# Patient Record
Sex: Female | Born: 1980 | Hispanic: No | Marital: Married | Smoking: Never smoker
Health system: Southern US, Community
[De-identification: ages and names within clinical notes are randomized; demographics above are authoritative.]

## PROBLEM LIST (undated history)

## (undated) ENCOUNTER — Inpatient Hospital Stay (HOSPITAL_COMMUNITY): Payer: Self-pay

## (undated) ENCOUNTER — Emergency Department (HOSPITAL_COMMUNITY): Discharge status: Against Medical Advice | Payer: BC Managed Care – PPO

## (undated) DIAGNOSIS — B1910 Unspecified viral hepatitis B without hepatic coma: Secondary | ICD-10-CM

## (undated) DIAGNOSIS — D219 Benign neoplasm of connective and other soft tissue, unspecified: Secondary | ICD-10-CM

## (undated) DIAGNOSIS — O98419 Viral hepatitis complicating pregnancy, unspecified trimester: Secondary | ICD-10-CM

## (undated) HISTORY — DX: Viral hepatitis complicating pregnancy, unspecified trimester: O98.419

## (undated) HISTORY — DX: Unspecified viral hepatitis B without hepatic coma: B19.10

---

## 2012-06-26 ENCOUNTER — Other Ambulatory Visit: Payer: Self-pay | Admitting: Obstetrics & Gynecology

## 2012-06-29 ENCOUNTER — Inpatient Hospital Stay (HOSPITAL_COMMUNITY): Admission: RE | Admit: 2012-06-29 | Payer: Self-pay | Source: Ambulatory Visit

## 2012-06-30 ENCOUNTER — Encounter (HOSPITAL_COMMUNITY): Payer: Self-pay | Admitting: Anesthesiology

## 2012-06-30 ENCOUNTER — Encounter (HOSPITAL_COMMUNITY)
Admission: RE | Disposition: A | Discharge status: Home / Self Care | Payer: Self-pay | Source: Ambulatory Visit | Attending: Obstetrics & Gynecology

## 2012-06-30 ENCOUNTER — Encounter (HOSPITAL_COMMUNITY): Payer: Self-pay | Admitting: *Deleted

## 2012-06-30 ENCOUNTER — Ambulatory Visit (HOSPITAL_COMMUNITY)
Admission: RE | Admit: 2012-06-30 | Discharge: 2012-06-30 | Disposition: A | Discharge status: Home / Self Care | Payer: BC Managed Care – PPO | Source: Ambulatory Visit | Attending: Obstetrics & Gynecology | Admitting: Obstetrics & Gynecology

## 2012-06-30 ENCOUNTER — Ambulatory Visit (HOSPITAL_COMMUNITY): Payer: BC Managed Care – PPO | Admitting: Anesthesiology

## 2012-06-30 DIAGNOSIS — N840 Polyp of corpus uteri: Secondary | ICD-10-CM | POA: Insufficient documentation

## 2012-06-30 DIAGNOSIS — R9389 Abnormal findings on diagnostic imaging of other specified body structures: Secondary | ICD-10-CM | POA: Insufficient documentation

## 2012-06-30 DIAGNOSIS — N920 Excessive and frequent menstruation with regular cycle: Secondary | ICD-10-CM | POA: Insufficient documentation

## 2012-06-30 HISTORY — PX: DILITATION & CURRETTAGE/HYSTROSCOPY WITH VERSAPOINT RESECTION: SHX5571

## 2012-06-30 LAB — CBC
Platelets: 247 10*3/uL (ref 150–400)
RDW: 12.8 % (ref 11.5–15.5)
WBC: 6.3 10*3/uL (ref 4.0–10.5)

## 2012-06-30 SURGERY — DILATATION & CURETTAGE/HYSTEROSCOPY WITH VERSAPOINT RESECTION
Anesthesia: General | Wound class: Clean Contaminated

## 2012-06-30 MED ORDER — LACTATED RINGERS IV SOLN
INTRAVENOUS | Status: DC
  Administered 2012-06-30: 13:00:00 via INTRAVENOUS
  Administered 2012-06-30: 100 mL/h via INTRAVENOUS

## 2012-06-30 MED ORDER — PROPOFOL 10 MG/ML IV BOLUS
INTRAVENOUS | Status: DC | PRN
  Administered 2012-06-30: 170 mg via INTRAVENOUS

## 2012-06-30 MED ORDER — CHLOROPROCAINE HCL 1 % IJ SOLN
INTRAMUSCULAR | Status: AC
  Filled 2012-06-30: qty 30

## 2012-06-30 MED ORDER — OXYCODONE-ACETAMINOPHEN 7.5-325 MG PO TABS: 1.0000 | ORAL_TABLET | ORAL | Status: DC | PRN

## 2012-06-30 MED ORDER — CEFAZOLIN SODIUM-DEXTROSE 2-3 GM-% IV SOLR
2.0000 g | INTRAVENOUS | Status: AC
  Administered 2012-06-30: 2 g via INTRAVENOUS

## 2012-06-30 MED ORDER — MIDAZOLAM HCL 5 MG/5ML IJ SOLN
INTRAMUSCULAR | Status: DC | PRN
  Administered 2012-06-30: 2 mg via INTRAVENOUS

## 2012-06-30 MED ORDER — CHLOROPROCAINE HCL 1 % IJ SOLN
INTRAMUSCULAR | Status: DC | PRN
  Administered 2012-06-30: 20 mL

## 2012-06-30 MED ORDER — KETOROLAC TROMETHAMINE 30 MG/ML IJ SOLN
INTRAMUSCULAR | Status: AC
  Filled 2012-06-30: qty 1

## 2012-06-30 MED ORDER — LIDOCAINE HCL (CARDIAC) 20 MG/ML IV SOLN
INTRAVENOUS | Status: DC | PRN
  Administered 2012-06-30: 50 mg via INTRAVENOUS

## 2012-06-30 MED ORDER — FENTANYL CITRATE 0.05 MG/ML IJ SOLN
25.0000 ug | INTRAMUSCULAR | Status: DC | PRN
  Administered 2012-06-30 (×2): 50 ug via INTRAVENOUS

## 2012-06-30 MED ORDER — MIDAZOLAM HCL 2 MG/2ML IJ SOLN
INTRAMUSCULAR | Status: AC
  Filled 2012-06-30: qty 2

## 2012-06-30 MED ORDER — CEFAZOLIN SODIUM-DEXTROSE 2-3 GM-% IV SOLR
INTRAVENOUS | Status: AC
  Filled 2012-06-30: qty 50

## 2012-06-30 MED ORDER — FENTANYL CITRATE 0.05 MG/ML IJ SOLN
INTRAMUSCULAR | Status: AC
  Filled 2012-06-30: qty 2

## 2012-06-30 MED ORDER — ONDANSETRON HCL 4 MG/2ML IJ SOLN
INTRAMUSCULAR | Status: DC | PRN
  Administered 2012-06-30: 4 mg via INTRAVENOUS

## 2012-06-30 MED ORDER — SODIUM CHLORIDE 0.9 % IR SOLN
Status: DC | PRN
  Administered 2012-06-30: 1

## 2012-06-30 MED ORDER — ONDANSETRON HCL 4 MG/2ML IJ SOLN
INTRAMUSCULAR | Status: AC
  Filled 2012-06-30: qty 2

## 2012-06-30 MED ORDER — LIDOCAINE HCL (CARDIAC) 20 MG/ML IV SOLN
INTRAVENOUS | Status: AC
  Filled 2012-06-30: qty 5

## 2012-06-30 MED ORDER — FENTANYL CITRATE 0.05 MG/ML IJ SOLN
INTRAMUSCULAR | Status: AC
  Administered 2012-06-30: 50 ug via INTRAVENOUS
  Filled 2012-06-30: qty 2

## 2012-06-30 MED ORDER — KETOROLAC TROMETHAMINE 30 MG/ML IJ SOLN
INTRAMUSCULAR | Status: DC | PRN
  Administered 2012-06-30: 30 mg via INTRAVENOUS

## 2012-06-30 MED ORDER — PROPOFOL 10 MG/ML IV EMUL
INTRAVENOUS | Status: AC
  Filled 2012-06-30: qty 20

## 2012-06-30 MED ORDER — FENTANYL CITRATE 0.05 MG/ML IJ SOLN
INTRAMUSCULAR | Status: DC | PRN
  Administered 2012-06-30: 50 ug via INTRAVENOUS
  Administered 2012-06-30 (×2): 25 ug via INTRAVENOUS

## 2012-06-30 SURGICAL SUPPLY — 14 items
CANISTER SUCTION 2500CC (MISCELLANEOUS) ×2 IMPLANT
CATH ROBINSON RED A/P 16FR (CATHETERS) ×2 IMPLANT
CLOTH BEACON ORANGE TIMEOUT ST (SAFETY) ×2 IMPLANT
CONTAINER PREFILL 10% NBF 60ML (FORM) ×4 IMPLANT
DRESSING TELFA 8X3 (GAUZE/BANDAGES/DRESSINGS) ×2 IMPLANT
ELECTRODE RT ANGLE VERSAPOINT (CUTTING LOOP) ×2 IMPLANT
GLOVE BIO SURGEON STRL SZ 6.5 (GLOVE) ×4 IMPLANT
GLOVE BIOGEL PI IND STRL 7.0 (GLOVE) ×1 IMPLANT
GLOVE BIOGEL PI INDICATOR 7.0 (GLOVE) ×1
GOWN STRL REIN XL XLG (GOWN DISPOSABLE) ×6 IMPLANT
PACK HYSTEROSCOPY LF (CUSTOM PROCEDURE TRAY) ×2 IMPLANT
PAD OB MATERNITY 4.3X12.25 (PERSONAL CARE ITEMS) ×2 IMPLANT
TOWEL OR 17X24 6PK STRL BLUE (TOWEL DISPOSABLE) ×4 IMPLANT
WATER STERILE IRR 1000ML POUR (IV SOLUTION) ×2 IMPLANT

## 2012-06-30 NOTE — Transfer of Care (Signed)
Immediate Anesthesia Transfer of Care Note  Patient: Erica Hurst  Procedure(s) Performed: Procedure(s): DILATATION & CURETTAGE/HYSTEROSCOPY WITH VERSAPOINT RESECTION (N/A)  Patient Location: PACU  Anesthesia Type:General  Level of Consciousness: awake, alert , oriented and patient cooperative  Airway & Oxygen Therapy: Patient Spontanous Breathing and Patient connected to nasal cannula oxygen  Post-op Assessment: Report given to PACU RN and Post -op Vital signs reviewed and stable  Post vital signs: Reviewed and stable  Complications: No apparent anesthesia complications

## 2012-06-30 NOTE — Discharge Summary (Signed)
  Physician Discharge Summary  Patient ID: Erica Hurst MRN: [id number] DOB/AGE: 09-08-1980 31 y.o.  Admit date: 06/30/2012 Discharge date: 06/30/2012  Admission Diagnoses: Endometrial Polyp  58558  Discharge Diagnoses: Endometrial Polyp  40981        Active Problems:   * No active hospital problems. *   Discharged Condition: good  Hospital Course:  Outpatient  Consults: None  Treatments: surgery: Hysteroscopy, Versapoint resection, D+C.  Disposition: Final discharge disposition not confirmed     Medication List    TAKE these medications       ibuprofen 200 MG tablet  Commonly known as:  ADVIL,MOTRIN  Take 400 mg by mouth 2 (two) times daily as needed for pain.     multivitamin with minerals Tabs  Take 1 tablet by mouth daily.     oxyCODONE-acetaminophen 7.5-325 MG per tablet  Commonly known as:  PERCOCET  Take 1 tablet by mouth every 4 (four) hours as needed for pain.           Follow-up Information   Follow up with Laderius Valbuena,MARIE-LYNE, MD In 3 weeks.   Contact information:   [address] [place] [address] [phone]       Signed: Genia Del, MD 06/30/2012, 12:48 PM

## 2012-06-30 NOTE — Anesthesia Postprocedure Evaluation (Signed)
  Anesthesia Post-op Note  Patient: Erica Hurst  Procedure(s) Performed: Procedure(s): DILATATION & CURETTAGE/HYSTEROSCOPY WITH VERSAPOINT RESECTION (N/A)  Patient is awake and responsive. Pain and nausea are reasonably well controlled. Vital signs are stable and clinically acceptable. Oxygen saturation is clinically acceptable. There are no apparent anesthetic complications at this time. Patient is ready for discharge.

## 2012-06-30 NOTE — H&P (Signed)
Erica Hurst is an 32 y.o. female  G0  RP:  Menometro with IU polyp  Pertinent Gynecological History: Menses: flow is excessive with use of many pads or tampons on heaviest days Bleeding: intermenstrual bleeding Contraception: none Blood transfusions: none Sexually transmitted diseases: no past history Previous GYN Procedures: none  Last pap: normal OB History: G0   Menstrual History:  Patient's last menstrual period was 06/22/2012.    History reviewed. No pertinent past medical history.  History reviewed. No pertinent past surgical history.  History reviewed. No pertinent family history.  Social History:  reports that she has never smoked. She does not have any smokeless tobacco history on file. Her alcohol and drug histories are not on file.  Allergies: No Known Allergies  Prescriptions prior to admission  Medication Sig Dispense Refill  . ibuprofen (ADVIL,MOTRIN) 200 MG tablet Take 400 mg by mouth 2 (two) times daily as needed for pain.      . Multiple Vitamin (MULTIVITAMIN WITH MINERALS) TABS Take 1 tablet by mouth daily.         Last menstrual period 06/22/2012.  Pelvic US thick endometrium.  SonoHysto:  IU polyp  Results for orders placed during the hospital encounter of 06/30/12 (from the past 24 hour(s))  CBC     Status: Abnormal   Collection Time    06/30/12 10:40 AM      Result Value Range   WBC 6.3  4.0 - 10.5 K/uL   RBC 5.12 (*) 3.87 - 5.11 MIL/uL   Hemoglobin 14.7  12.0 - 15.0 g/dL   HCT 62.1  30.8 - 65.7 %   MCV 82.8  78.0 - 100.0 fL   MCH 28.7  26.0 - 34.0 pg   MCHC 34.7  30.0 - 36.0 g/dL   RDW 84.6  96.2 - 95.2 %   Platelets 247  150 - 400 K/uL    No results found.  Assessment/Plan: Menometro with IU polyp for HSC resection Versapoint, D+C.  Surgery and risks reviewed.  Cyara Devoto,MARIE-LYNE 06/30/2012, 11:14 AM

## 2012-06-30 NOTE — Anesthesia Preprocedure Evaluation (Signed)

## 2012-06-30 NOTE — Op Note (Signed)
06/30/2012  12:33 PM  PATIENT:  Erica Hurst  32 y.o. female  PRE-OPERATIVE DIAGNOSIS: Menometrorrhagia, Endometrial Polyp  [phone]  POST-OPERATIVE DIAGNOSIS:  Menometrorrhagia, Endometrial Polyp, thick endometrium  [id number]  PROCEDURE:  Procedure(s): DILATATION & CURETTAGE/HYSTEROSCOPY WITH VERSAPOINT RESECTION  SURGEON:  Surgeon(s): Genia Del, MD  ASSISTANTS: none   ANESTHESIA:   general with LM  PROCEDURE:  Under general anesthesia with laryngeal mask, the patient is in lithotomy position.  She is prepped with Betadine on the suprapubic, vulvar and vaginal areas.  She is draped as usual. The bladder is catheterized.  The vaginal exam revealed and anteverted uterus mobile no adnexal mass. The speculum is inserted in the vagina. The anterior lip of the cervix is grasped with a tenaculum. A paracervical block is done with Nesacaine 1% a total of 20 cc at 4 and 8:00.  Dilation of the cervix with Hegar dilators up to #33 without difficulty. The operative hysteroscope is inserted in the intrauterine cavity with the VersaPoint device.  Pictures are taken. The endometrium is 6 throughout the intrauterine cavity. A polypoid area is visualized on the right posterior wall.  This polypoid lesion is resected. The other thickest areas of endometrium are resected superficially as well.  We then removed the hysteroscope. We proceed with a systematic curettage of the intrauterine cavity with a sharp curet on all surfaces.  Both specimens are sent together to pathology.  We go back with the hysteroscope and confirmed a normal intrauterine cavity with both ostia visualized and pictures taken. Hemostasis is adequate. We removed the hysteroscope. We also removed the tenaculum on the intraloop of the cervix. Hemostasis is adequate. Were removed the speculum.  The patient was brought to recovery room in good and stable status.  ESTIMATED BLOOD LOSS:  10 cc FLUID DEFICIT: 180 CC  Intake/Output Summary  (Last 24 hours) at 06/30/12 1233 Last data filed at 06/30/12 1215  Gross per 24 hour  Intake    900 ml  Output      0 ml  Net    900 ml     BLOOD ADMINISTERED:none   LOCAL MEDICATIONS USED:  Nesacaine 1% 20 cc.  SPECIMEN:  Source of Specimen:  Endometrial curettings, resection of polypoid endometrium  DISPOSITION OF SPECIMEN:  PATHOLOGY  COUNTS:  YES  PLAN OF CARE: Transfer to PACU   Genia Del  06/30/2102 at 12:35 pm

## 2012-06-30 NOTE — Preoperative (Signed)
Beta Blockers   Reason not to administer Beta Blockers:Not Applicable 

## 2012-07-03 ENCOUNTER — Encounter (HOSPITAL_COMMUNITY): Payer: Self-pay | Admitting: Obstetrics & Gynecology

## 2013-11-07 LAB — OB RESULTS CONSOLE ABO/RH: RH Type: POSITIVE

## 2013-11-07 LAB — OB RESULTS CONSOLE RUBELLA ANTIBODY, IGM: Rubella: IMMUNE

## 2013-11-07 LAB — OB RESULTS CONSOLE RPR: RPR: NONREACTIVE

## 2013-11-07 LAB — OB RESULTS CONSOLE ANTIBODY SCREEN: ANTIBODY SCREEN: NEGATIVE

## 2013-11-07 LAB — OB RESULTS CONSOLE GC/CHLAMYDIA
CHLAMYDIA, DNA PROBE: NEGATIVE
GC PROBE AMP, GENITAL: NEGATIVE

## 2013-11-07 LAB — OB RESULTS CONSOLE HEPATITIS B SURFACE ANTIGEN: Hepatitis B Surface Ag: POSITIVE

## 2013-11-07 LAB — OB RESULTS CONSOLE HIV ANTIBODY (ROUTINE TESTING): HIV: NONREACTIVE

## 2013-11-21 LAB — OB RESULTS CONSOLE GBS: GBS: POSITIVE

## 2013-11-23 ENCOUNTER — Encounter: Payer: Self-pay | Admitting: Internal Medicine

## 2013-11-23 ENCOUNTER — Ambulatory Visit (INDEPENDENT_AMBULATORY_CARE_PROVIDER_SITE_OTHER): Payer: Medicaid Other | Admitting: Internal Medicine

## 2013-11-23 VITALS — BP 105/70 | HR 90 | Temp 97.9°F | Wt 163.0 lb

## 2013-11-23 DIAGNOSIS — O98519 Other viral diseases complicating pregnancy, unspecified trimester: Secondary | ICD-10-CM

## 2013-11-23 DIAGNOSIS — O98411 Viral hepatitis complicating pregnancy, first trimester: Principal | ICD-10-CM

## 2013-11-23 DIAGNOSIS — B1910 Unspecified viral hepatitis B without hepatic coma: Secondary | ICD-10-CM | POA: Insufficient documentation

## 2013-11-23 LAB — COMPLETE METABOLIC PANEL WITH GFR
ALK PHOS: 28 U/L — AB (ref 39–117)
ALT: 16 U/L (ref 0–35)
AST: 12 U/L (ref 0–37)
Albumin: 3.4 g/dL — ABNORMAL LOW (ref 3.5–5.2)
BILIRUBIN TOTAL: 0.2 mg/dL (ref 0.2–1.2)
BUN: 7 mg/dL (ref 6–23)
CO2: 24 mEq/L (ref 19–32)
Calcium: 9.1 mg/dL (ref 8.4–10.5)
Chloride: 104 mEq/L (ref 96–112)
Creat: 0.56 mg/dL (ref 0.50–1.10)
GFR, Est African American: >89 mL/min
GLUCOSE: 86 mg/dL (ref 70–99)
Potassium: 4 mEq/L (ref 3.5–5.3)
SODIUM: 136 meq/L (ref 135–145)
Total Protein: 6.3 g/dL (ref 6.0–8.3)

## 2013-11-23 LAB — HEPATITIS A ANTIBODY, TOTAL: Hep A Total Ab: REACTIVE — AB

## 2013-11-23 LAB — HEPATITIS C ANTIBODY: HCV AB: NEGATIVE

## 2013-11-23 NOTE — Addendum Note (Signed)
Addended by: Dolan Amen D on: 11/23/2013 09:56 AM   Modules accepted: Orders

## 2013-11-23 NOTE — Assessment & Plan Note (Signed)
I discussed the natural history of hepatitis B, transmission, long term prognosis, indications for treatment including elevated viral load during the third trimester.  She is asymptomatic.  Will check labs and have her return in about 2 weeks with results.  Will also check elastography if validated in pregnancy (will check with radiology), otherwise will do after delivery.  I discussed routine follow up including twice a year ultrasound.  I also discussed treatment for the baby which will be done with vaccine and immunoglobulin.

## 2013-11-23 NOTE — Progress Notes (Signed)
   Subjective:    Patient ID: Roselyn Meier, female    DOB: 09/11/1980, 33 y.o.   MRN: [id number]  HPI Here for evaluation for positive hepatitis B surface Ag test.  Is her first pregnancy and has no known history of hepatitis B or other liver disease.  No known family members with hepatitis B or liver disease. No history of jaundice.  Is HIV negative.  Is french speaking and translation done through her husband.  No other medical problems.  Is in her first trimester with due date about December 19th.     Review of Systems  Constitutional: Negative for fever and unexpected weight change.  Gastrointestinal: Negative for abdominal pain and diarrhea.  Allergic/Immunologic: Negative for immunocompromised state.  Neurological: Negative for dizziness.       Objective:   Physical Exam  Constitutional: She appears well-developed and well-nourished. No distress.  HENT:  Mouth/Throat: No oropharyngeal exudate.  Eyes: No scleral icterus.  Cardiovascular: Normal rate, regular rhythm and normal heart sounds.   No murmur heard. Pulmonary/Chest: Effort normal and breath sounds normal. No respiratory distress. She has no wheezes.  Abdominal: Soft. She exhibits no distension.  Skin: No rash noted.          Assessment & Plan:

## 2013-11-26 LAB — HEPATITIS B E ANTIGEN: Hepatitis Be Antigen: NONREACTIVE

## 2013-11-26 LAB — HEPATITIS B E ANTIBODY: Hepatitis Be Antibody: REACTIVE — AB

## 2013-11-27 LAB — HEPATITIS B DNA, ULTRAQUANTITATIVE, PCR
HEPATITIS B DNA (CALC): 384 {copies}/mL — AB (ref <116)
HEPATITIS B DNA: 66 [IU]/mL — AB (ref <20)

## 2013-12-17 ENCOUNTER — Ambulatory Visit: Payer: Medicaid Other | Admitting: Internal Medicine

## 2013-12-17 ENCOUNTER — Encounter: Payer: Self-pay | Admitting: Internal Medicine

## 2013-12-17 ENCOUNTER — Ambulatory Visit (INDEPENDENT_AMBULATORY_CARE_PROVIDER_SITE_OTHER): Payer: Medicaid Other | Admitting: Internal Medicine

## 2013-12-17 VITALS — BP 115/73 | HR 86 | Temp 98.2°F | Wt 165.0 lb

## 2013-12-17 DIAGNOSIS — B1910 Unspecified viral hepatitis B without hepatic coma: Secondary | ICD-10-CM

## 2013-12-17 DIAGNOSIS — O98519 Other viral diseases complicating pregnancy, unspecified trimester: Secondary | ICD-10-CM

## 2013-12-17 DIAGNOSIS — O98411 Viral hepatitis complicating pregnancy, first trimester: Principal | ICD-10-CM

## 2013-12-17 NOTE — Assessment & Plan Note (Addendum)
Very minimal viral load and no indication for treatment.  I will recheck her DNA after delivery and do elastography as well after that.  She is going to call a few months after delivery to schedule a follow up with me and will do above. 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  Neonate will still require HBIG and vaccine x 3.

## 2013-12-17 NOTE — Progress Notes (Signed)
   Subjective:    Patient ID: Erica Hurst, female    DOB: [birth year], 33 y.o.   MRN: [id number]  HPI  Here for evaluation for positive hepatitis B surface Ag test.  Is her first pregnancy and has no known history of hepatitis B or other liver disease.  No known family members with hepatitis B or liver disease. No history of jaundice.  Is HIV negative.  Is french speaking and translation done today by interpreter.  No other medical problems.  Is in her first trimester with due date about December 19th.     Her work up revealed e Ag negative, viral load of 66 IU/384 copies per mL.  LFTs wnl.  E Ab positive.     Review of Systems  Constitutional: Negative for fever and unexpected weight change.  Gastrointestinal: Negative for abdominal pain and diarrhea.  Allergic/Immunologic: Negative for immunocompromised state.  Neurological: Negative for dizziness.       Objective:   Physical Exam  Constitutional: She appears well-developed and well-nourished. No distress.  HENT:  Mouth/Throat: No oropharyngeal exudate.  Eyes: No scleral icterus.  Cardiovascular: Normal rate, regular rhythm and normal heart sounds.   No murmur heard. Pulmonary/Chest: Effort normal and breath sounds normal. No respiratory distress. She has no wheezes.  Abdominal: Soft. She exhibits no distension.  Skin: No rash noted.          Assessment & Plan:

## 2014-01-04 ENCOUNTER — Encounter (HOSPITAL_COMMUNITY): Payer: Self-pay | Admitting: *Deleted

## 2014-01-04 ENCOUNTER — Inpatient Hospital Stay (HOSPITAL_COMMUNITY)
Admission: AD | Admit: 2014-01-04 | Discharge: 2014-01-05 | Disposition: A | Discharge status: Home / Self Care | Payer: Medicaid Other | Source: Ambulatory Visit | Attending: Obstetrics & Gynecology | Admitting: Obstetrics & Gynecology

## 2014-01-04 DIAGNOSIS — O99891 Other specified diseases and conditions complicating pregnancy: Secondary | ICD-10-CM | POA: Diagnosis not present

## 2014-01-04 DIAGNOSIS — N949 Unspecified condition associated with female genital organs and menstrual cycle: Secondary | ICD-10-CM | POA: Diagnosis not present

## 2014-01-04 DIAGNOSIS — O212 Late vomiting of pregnancy: Secondary | ICD-10-CM | POA: Insufficient documentation

## 2014-01-04 DIAGNOSIS — R109 Unspecified abdominal pain: Secondary | ICD-10-CM | POA: Insufficient documentation

## 2014-01-04 DIAGNOSIS — O9989 Other specified diseases and conditions complicating pregnancy, childbirth and the puerperium: Secondary | ICD-10-CM

## 2014-01-04 HISTORY — DX: Benign neoplasm of connective and other soft tissue, unspecified: D21.9

## 2014-01-04 LAB — URINALYSIS, ROUTINE W REFLEX MICROSCOPIC
Bilirubin Urine: NEGATIVE
Glucose, UA: NEGATIVE mg/dL
Hgb urine dipstick: NEGATIVE
Ketones, ur: NEGATIVE mg/dL
Leukocytes, UA: NEGATIVE
NITRITE: NEGATIVE
Protein, ur: NEGATIVE mg/dL
UROBILINOGEN UA: 0.2 mg/dL (ref 0.0–1.0)
pH: 5.5 (ref 5.0–8.0)

## 2014-01-04 NOTE — MAU Note (Addendum)
PT SAYS SHE STARTED YESTERDAY HAVING  PAIN IN LOWER ABD .   TODAY- AFTERNOON- FEELING NAUSEA-  NO VOMITING.  SEEN IN OFFICE ON Tuesday-  OFFICE  CALLED HER THIS AM- TOLD UTI-   CALLED HER IN AMOX - STARTED TODAY - FELT NAUSEA.       SAYS SHE HAS HX OF UTI

## 2014-01-05 MED ORDER — ONDANSETRON 8 MG PO TBDP: 8.0000 mg | ORAL_TABLET | Freq: Three times a day (TID) | ORAL | Status: DC | PRN

## 2014-01-05 NOTE — Discharge Instructions (Signed)
Nausea and Vomiting °Nausea is a sick feeling that often comes before throwing up (vomiting). Vomiting is a reflex where stomach contents come out of your mouth. Vomiting can cause severe loss of body fluids (dehydration). Children and elderly adults can become dehydrated quickly, especially if they also have diarrhea. Nausea and vomiting are symptoms of a condition or disease. It is important to find the cause of your symptoms. °CAUSES  °· Direct irritation of the stomach lining. This irritation can result from increased acid production (gastroesophageal reflux disease), infection, food poisoning, taking certain medicines (such as nonsteroidal anti-inflammatory drugs), alcohol use, or tobacco use. °· Signals from the brain. These signals could be caused by a headache, heat exposure, an inner ear disturbance, increased pressure in the brain from injury, infection, a tumor, or a concussion, pain, emotional stimulus, or metabolic problems. °· An obstruction in the gastrointestinal tract (bowel obstruction). °· Illnesses such as diabetes, hepatitis, gallbladder problems, appendicitis, kidney problems, cancer, sepsis, atypical symptoms of a heart attack, or eating disorders. °· Medical treatments such as chemotherapy and radiation. °· Receiving medicine that makes you sleep (general anesthetic) during surgery. °DIAGNOSIS °Your caregiver may ask for tests to be done if the problems do not improve after a few days. Tests may also be done if symptoms are severe or if the reason for the nausea and vomiting is not clear. Tests may include: °· Urine tests. °· Blood tests. °· Stool tests. °· Cultures (to look for evidence of infection). °· X-rays or other imaging studies. °Test results can help your caregiver make decisions about treatment or the need for additional tests. °TREATMENT °You need to stay well hydrated. Drink frequently but in small amounts. You may wish to drink water, sports drinks, clear broth, or eat frozen  ice pops or gelatin dessert to help stay hydrated. When you eat, eating slowly may help prevent nausea. There are also some antinausea medicines that may help prevent nausea. °HOME CARE INSTRUCTIONS  °· Take all medicine as directed by your caregiver. °· If you do not have an appetite, do not force yourself to eat. However, you must continue to drink fluids. °· If you have an appetite, eat a normal diet unless your caregiver tells you differently. °¨ Eat a variety of complex carbohydrates (rice, wheat, potatoes, bread), lean meats, yogurt, fruits, and vegetables. °¨ Avoid high-fat foods because they are more difficult to digest. °· Drink enough water and fluids to keep your urine clear or pale yellow. °· If you are dehydrated, ask your caregiver for specific rehydration instructions. Signs of dehydration may include: °¨ Severe thirst. °¨ Dry lips and mouth. °¨ Dizziness. °¨ Dark urine. °¨ Decreasing urine frequency and amount. °¨ Confusion. °¨ Rapid breathing or pulse. °SEEK IMMEDIATE MEDICAL CARE IF:  °· You have blood or brown flecks (like coffee grounds) in your vomit. °· You have black or bloody stools. °· You have a severe headache or stiff neck. °· You are confused. °· You have severe abdominal pain. °· You have chest pain or trouble breathing. °· You do not urinate at least once every 8 hours. °· You develop cold or clammy skin. °· You continue to vomit for longer than 24 to 48 hours. °· You have a fever. °MAKE SURE YOU:  °· Understand these instructions. °· Will watch your condition. °· Will get help right away if you are not doing well or get worse. °Document Released: 05/03/2005 Document Revised: 07/26/2011 Document Reviewed: 09/30/2010 °ExitCare® Patient Information ©2015 [employer]. This information is not intended   to replace advice given to you by your health care provider. Make sure you discuss any questions you have with your health care provider. Penicillin V tablets What is this  medicine? PENICILLIN V (pen i SILL in V) is a penicillin antibiotic. It is used to treat certain kinds of bacterial infections. It will not work for colds, flu, or other viral infections. This medicine may be used for other purposes; ask your health care provider or pharmacist if you have questions. COMMON BRAND NAME(S): Bayard Beaver, Veetids What should I tell my health care provider before I take this medicine? They need to know if you have any of these conditions: -asthma -bowel disease, like colitis -eczema -kidney disease -an unusual or allergic reaction to penicillin, cephalosporins, other antibiotics or medicines, foods, tartrazine or other dyes, or preservatives -pregnant or trying to get pregnant -breast-feeding How should I use this medicine? Take this medicine by mouth with a full glass of water. Follow the directions on the prescription label. Take your medicine at regular intervals. Do not take your medicine more often than directed. Take all of your medicine as directed even if you think your are better. Do not skip doses or stop your medicine early. Talk to your pediatrician regarding the use of this medicine in children. While this drug may be prescribed for selected conditions, precautions do apply. Overdosage: If you think you have taken too much of this medicine contact a poison control center or emergency room at once. NOTE: This medicine is only for you. Do not share this medicine with others. What if I miss a dose? If you miss a dose, take it as soon as you can. If it is almost time for your next dose, take only that dose. Do not take double or extra doses. What may interact with this medicine? -birth control pills -methotrexate -other antibiotics -probenecid -some vaccines This list may not describe all possible interactions. Give your health care provider a list of all the medicines, herbs, non-prescription drugs, or dietary supplements you use. Also tell them if you  smoke, drink alcohol, or use illegal drugs. Some items may interact with your medicine. What should I watch for while using this medicine? Tell your doctor or health care professional if your symptoms do not improve. Do not treat diarrhea with over the counter products. Contact your doctor if you have diarrhea that lasts more than 2 days or if it is severe and watery. If you have diabetes, you may get a false-positive result for sugar in your urine. Check with your doctor or health care professional. Birth control pills may not work properly while you are taking this medicine. Talk to your doctor about using an extra method of birth control. What side effects may I notice from receiving this medicine? Side effects that you should report to your doctor or health care professional as soon as possible: -allergic reactions like skin rash or hives, swelling of the face, lips, or tongue -breathing problems -fever -new symptoms of infection -redness, blistering, peeling or loosening of the skin, including inside the mouth -unusually weak or tired Side effects that usually do not require medical attention (report to your doctor or health care professional if they continue or are bothersome): -diarrhea -headache -nausea, vomiting -sore mouth or tongue -stomach upset This list may not describe all possible side effects. Call your doctor for medical advice about side effects. You may report side effects to FDA at 1-800-FDA-1088. Where should I keep my medicine? Keep out  of the reach of children. Store at room temperature between 15 and 30 degrees C (59 and 86 degrees F). Keep container tightly closed. Throw away any unused medicine after the expiration date. NOTE: This sheet is a summary. It may not cover all possible information. If you have questions about this medicine, talk to your doctor, pharmacist, or health care provider.  2015, Elsevier/Gold Standard. (2007-11-30 12:59:13)

## 2014-01-05 NOTE — MAU Provider Note (Signed)
History   Patient is a 33y.o. G3P0020 at 23wks, who presents unannounced, for cramping and nausea. Patient reports that this started yesterday and started taking medication today for bacteriuria.  Patient reports nausea started today at around 5pm prior to taking PCN V and states she ate at 2pm.  Patient denies vomiting, cramping/contractions, LOF, or VB and reports active fetus.    Patient Active Problem List   Diagnosis Date Noted  . 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  HPI  OB History   Grav Para Term Preterm Abortions TAB SAB Ect Mult Living   3    2  2          Past Medical History  Diagnosis Date  . Hepatitis B affecting pregnancy   . Fibroid     Past Surgical History  Procedure Laterality Date  . Dilitation & currettage/hystroscopy with versapoint resection N/A 06/30/2012    Procedure: DILATATION & CURETTAGE/HYSTEROSCOPY WITH VERSAPOINT RESECTION;  Surgeon: Princess Bruins, MD;  Location: [facility];  Service: Gynecology;  Laterality: N/A;    Family History  Problem Relation Age of Onset  . Liver disease Neg Hx     History  Substance Use Topics  . Smoking status: Never Smoker   . Smokeless tobacco: Not on file  . Alcohol Use: No    Allergies: No Known Allergies  Prescriptions prior to admission  Medication Sig Dispense Refill  . ibuprofen (ADVIL,MOTRIN) 200 MG tablet Take 400 mg by mouth 2 (two) times daily as needed for pain.      . Multiple Vitamin (MULTIVITAMIN WITH MINERALS) TABS Take 1 tablet by mouth daily.      Marland Kitchen oxyCODONE-acetaminophen (PERCOCET) 7.5-325 MG per tablet Take 1 tablet by mouth every 4 (four) hours as needed for pain.  20 tablet  0    ROS  See HPI Above Physical Exam   Blood pressure 113/63, pulse 92, temperature 99.2 F (37.3 C), temperature source Oral, resp. rate 20, height 5\' 2"  (1.575 m), weight 164 lb 2 oz (74.447 kg).  Physical Exam  Constitutional: She is oriented to  person, place, and time. She appears well-developed and well-nourished.  Cardiovascular: Normal rate.   Respiratory: Effort normal.  GI: Soft. There is tenderness in the right lower quadrant and left lower quadrant. There is no rigidity and no guarding.  Appears gravid--fundal height appropriate for GA   Musculoskeletal: Normal range of motion.  Neurological: She is alert and oriented to person, place, and time.  Skin: Skin is warm and dry.    ED Course  Assessment: IUP at 23wks Nausea Round Ligament Pain  Plan: -Patient declines anti-emetic -RX sent for usage prior to antibiotic usage -Discussed proper usage of antibiotics for UTI as patient currently taking once a day when should be taking BID -Discussed round ligament pain -Bleeding and PTL Precautions  -Keep appt as scheduled: 9/15 -Call if you have any questions or concerns prior to your next visit.  -Discharged to home in stable condition   Jazzy Parmer LYNN CNM, MSN 01/05/2014 12:10 AM

## 2014-03-03 ENCOUNTER — Inpatient Hospital Stay (HOSPITAL_COMMUNITY)
Admission: AD | Admit: 2014-03-03 | Discharge: 2014-03-03 | Disposition: A | Discharge status: Home / Self Care | Payer: Medicaid Other | Source: Ambulatory Visit | Attending: Obstetrics and Gynecology | Admitting: Obstetrics and Gynecology

## 2014-03-03 ENCOUNTER — Encounter (HOSPITAL_COMMUNITY): Payer: Self-pay

## 2014-03-03 ENCOUNTER — Inpatient Hospital Stay (HOSPITAL_COMMUNITY): Payer: Medicaid Other

## 2014-03-03 DIAGNOSIS — B1910 Unspecified viral hepatitis B without hepatic coma: Secondary | ICD-10-CM

## 2014-03-03 DIAGNOSIS — O368130 Decreased fetal movements, third trimester, not applicable or unspecified: Secondary | ICD-10-CM | POA: Insufficient documentation

## 2014-03-03 DIAGNOSIS — Z3A31 31 weeks gestation of pregnancy: Secondary | ICD-10-CM | POA: Insufficient documentation

## 2014-03-03 DIAGNOSIS — O98411 Viral hepatitis complicating pregnancy, first trimester: Secondary | ICD-10-CM

## 2014-03-03 DIAGNOSIS — O368190 Decreased fetal movements, unspecified trimester, not applicable or unspecified: Secondary | ICD-10-CM

## 2014-03-03 DIAGNOSIS — O368131 Decreased fetal movements, third trimester, fetus 1: Secondary | ICD-10-CM

## 2014-03-03 NOTE — Discharge Instructions (Signed)

## 2014-03-03 NOTE — MAU Provider Note (Signed)
Erica Hurst is a 33 y.o. G3P0020 at 31.1 weeks c/o decreased fetal movement x 1 day.  She denies vb or lof.  She has a history of two SAB, at 4 and 6 weeks.   History     Patient Active Problem List   Diagnosis Date Noted  . Hepatitis B affecting pregnancy in first trimester 11/23/2013    Chief Complaint  Patient presents with  . Decreased Fetal Movement   HPI  OB History   Grav Para Term Preterm Abortions TAB SAB Ect Mult Living   3    2  2          Past Medical History  Diagnosis Date  . Hepatitis B affecting pregnancy   . Fibroid     Past Surgical History  Procedure Laterality Date  . Dilitation & currettage/hystroscopy with versapoint resection N/A 06/30/2012    Procedure: DILATATION & CURETTAGE/HYSTEROSCOPY WITH VERSAPOINT RESECTION;  Surgeon: Princess Bruins, MD;  Location: [facility];  Service: Gynecology;  Laterality: N/A;    Family History  Problem Relation Age of Onset  . Liver disease Neg Hx     History  Substance Use Topics  . Smoking status: Never Smoker   . Smokeless tobacco: Not on file  . Alcohol Use: No    Allergies: No Known Allergies  Prescriptions prior to admission  Medication Sig Dispense Refill  . penicillin v potassium (VEETID) 500 MG tablet Take 500 mg by mouth 2 (two) times daily.      . Prenatal Vit-Fe Fumarate-FA (PRENATAL MULTIVITAMIN) TABS tablet Take 1 tablet by mouth daily at 12 noon.        ROS See HPI above, all other systems are negative  Physical Exam   Resp. rate 16, height 5\' 2"  (1.575 m), weight 76.885 kg (169 lb 8 oz).  Physical Exam Ext:  WNL ABD: Soft, non tender to palpation, no rebound or guarding SVE: deferred   ED Course  Assessment: IUP at  31.1weeks Membranes: intact FHR: 150 minimum variability, no decels no accel CTX:  occasional irribility   Plan: PO hydration [place] - BPP and AFI   Jahn Franchini, CNM, MSN 03/03/2014. 1:13 PM

## 2014-03-03 NOTE — MAU Note (Signed)
Pt states decreased fm since yesterday. Denies bleeding or abnormal discharge. Did have ctx's yesterday.

## 2014-03-03 NOTE — MAU Provider Note (Signed)
MAU Addendum Note  [place] results: FHR 144, anterior placenta, AFI WNL, BPP 8/8 DC to home  FU in the office in 1 week   Bayard More, CNM, MSN 03/03/2014. 3:20 PM

## 2014-03-18 ENCOUNTER — Encounter (HOSPITAL_COMMUNITY): Payer: Self-pay

## 2014-05-10 ENCOUNTER — Inpatient Hospital Stay (HOSPITAL_COMMUNITY)
Admission: AD | Admit: 2014-05-10 | Discharge: 2014-05-10 | Disposition: A | Discharge status: Home / Self Care | Payer: Medicaid Other | Source: Ambulatory Visit | Attending: Obstetrics and Gynecology | Admitting: Obstetrics and Gynecology

## 2014-05-10 ENCOUNTER — Encounter (HOSPITAL_COMMUNITY): Payer: Self-pay | Admitting: *Deleted

## 2014-05-10 DIAGNOSIS — O471 False labor at or after 37 completed weeks of gestation: Secondary | ICD-10-CM | POA: Insufficient documentation

## 2014-05-10 DIAGNOSIS — B1910 Unspecified viral hepatitis B without hepatic coma: Secondary | ICD-10-CM | POA: Diagnosis not present

## 2014-05-10 DIAGNOSIS — O98413 Viral hepatitis complicating pregnancy, third trimester: Secondary | ICD-10-CM | POA: Diagnosis not present

## 2014-05-10 DIAGNOSIS — Z3A40 40 weeks gestation of pregnancy: Secondary | ICD-10-CM | POA: Diagnosis not present

## 2014-05-10 DIAGNOSIS — O479 False labor, unspecified: Secondary | ICD-10-CM

## 2014-05-10 NOTE — MAU Provider Note (Signed)
History    Erica Hurst is a 33y.o. G3P0020 at 40.6wks who presents, unannounced, for contractions.  Patient states contractions started at 0100.  Patient reports VB and active fetus, but denies LOF.    Patient Active Problem List   Diagnosis Date Noted  . Hepatitis B affecting pregnancy in first trimester 11/23/2013    Chief Complaint  Patient presents with  . Labor Eval   HPI  OB History    Gravida Para Term Preterm AB TAB SAB Ectopic Multiple Living   3    2  2          Past Medical History  Diagnosis Date  . Hepatitis B affecting pregnancy   . Fibroid     Past Surgical History  Procedure Laterality Date  . Dilitation & currettage/hystroscopy with versapoint resection N/A 06/30/2012    Procedure: DILATATION & CURETTAGE/HYSTEROSCOPY WITH VERSAPOINT RESECTION;  Surgeon: Princess Bruins, MD;  Location: [facility];  Service: Gynecology;  Laterality: N/A;    Family History  Problem Relation Age of Onset  . Liver disease Neg Hx     History  Substance Use Topics  . Smoking status: Never Smoker   . Smokeless tobacco: Not on file  . Alcohol Use: No    Allergies: No Known Allergies  Prescriptions prior to admission  Medication Sig Dispense Refill Last Dose  . penicillin v potassium (VEETID) 500 MG tablet Take 500 mg by mouth 2 (two) times daily.   03/03/2014 at Unknown time  . Prenatal Vit-Fe Fumarate-FA (PRENATAL MULTIVITAMIN) TABS tablet Take 1 tablet by mouth daily at 12 noon.   03/02/2014 at Unknown time    ROS  See HPI Above Physical Exam   Blood pressure 116/80, pulse 76, temperature 98.4 F (36.9 C), temperature source Oral, resp. rate 18, height 5\' 6"  (1.676 m), weight 173 lb (78.472 kg).  Physical Exam SVE: 1/50/Ballotable/Medium/Posterior FHR: 135 bpm, Mod Var, -Decels, +Accels UC: Q3-49min, palpates mild ED Course  Assessment: IUP at 40.6wks Cat I FT Contractions  Plan: -Await for reactive NST -Patient given the option for ambulation or  discharge to home  -Patient opts to ambulate  Follow Up (0430) -Nurse call stating patient would like to go home -Strip Reviewed, reactive -Okay to discharge to home -Nurse instructed to provide family with information on who and when to call for concerns  Seana Underwood LYNN CNM, MSN 05/10/2014 4:31 AM

## 2014-05-10 NOTE — MAU Note (Signed)
Pt has changed her mind and would like to go home, Milinda Cave CNM aware and gave orders that pt. May be discharged.

## 2014-05-10 NOTE — MAU Note (Signed)
Pt states that she began contracting at 0100 with some bloody show and mucous. Pt denies leaking of fluid.

## 2014-05-10 NOTE — MAU Note (Signed)
J EMly CNM coming to unit.

## 2014-05-10 NOTE — Discharge Instructions (Signed)
Third Trimester of Pregnancy The third trimester is from week 29 through week 42, months 7 through 9. This trimester is when your unborn baby (fetus) is growing very fast. At the end of the ninth month, the unborn baby is about 20 inches in length. It weighs about 6-10 pounds.  HOME CARE   Avoid all smoking, herbs, and alcohol. Avoid drugs not approved by your doctor.  Only take medicine as told by your doctor. Some medicines are safe and some are not during pregnancy.  Exercise only as told by your doctor. Stop exercising if you start having cramps.  Eat regular, healthy meals.  Wear a good support bra if your breasts are tender.  Do not use hot tubs, steam rooms, or saunas.  Wear your seat belt when driving.  Avoid raw meat, uncooked cheese, and liter boxes and soil used by cats.  Take your prenatal vitamins.  Try taking medicine that helps you poop (stool softener) as needed, and if your doctor approves. Eat more fiber by eating fresh fruit, vegetables, and whole grains. Drink enough fluids to keep your pee (urine) clear or pale yellow.  Take warm water baths (sitz baths) to soothe pain or discomfort caused by hemorrhoids. Use hemorrhoid cream if your doctor approves.  If you have puffy, bulging veins (varicose veins), wear support hose. Raise (elevate) your feet for 15 minutes, 3-4 times a day. Limit salt in your diet.  Avoid heavy lifting, wear low heels, and sit up straight.  Rest with your legs raised if you have leg cramps or low back pain.  Visit your dentist if you have not gone during your pregnancy. Use a soft toothbrush to brush your teeth. Be gentle when you floss.  You can have sex (intercourse) unless your doctor tells you not to.  Do not travel far distances unless you must. Only do so with your doctor's approval.  Take prenatal classes.  Practice driving to the hospital.  Pack your hospital bag.  Prepare the baby's room.  Go to your doctor visits. GET  HELP IF: 1. You are not sure if you are in labor or if your water has broken. 2. You are dizzy. 3. You have mild cramps or pressure in your lower belly (abdominal). 4. You have a nagging pain in your belly area. 5. You continue to feel sick to your stomach (nauseous), throw up (vomit), or have watery poop (diarrhea). 6. You have bad smelling fluid coming from your vagina. 7. You have pain with peeing (urination). GET HELP RIGHT AWAY IF:   You have a fever.  You are leaking fluid from your vagina.  You are spotting or bleeding from your vagina.  You have severe belly cramping or pain.  You lose or gain weight rapidly.  You have trouble catching your breath and have chest pain.  You notice sudden or extreme puffiness (swelling) of your face, hands, ankles, feet, or legs.  You have not felt the baby move in over an hour.  You have severe headaches that do not go away with medicine.  You have vision changes. Document Released: 07/28/2009 Document Revised: 08/28/2012 Document Reviewed: 07/04/2012 [facility] Patient Information 2015 [place], [place]. This information is not intended to replace advice given to you by your health care provider. Make sure you discuss any questions you have with your health care provider. Fetal Movement Counts Patient Name: __________________________________________________ Patient Due Date: ____________________ Performing a fetal movement count is highly recommended in high-risk pregnancies, but it is good  for every pregnant woman to do. Your health care provider may ask you to start counting fetal movements at 28 weeks of the pregnancy. Fetal movements often increase:  After eating a full meal.  After physical activity.  After eating or drinking something sweet or cold.  At rest. Pay attention to when you feel the baby is most active. This will help you notice a pattern of your baby's sleep and wake cycles and what factors contribute to an increase in  fetal movement. It is important to perform a fetal movement count at the same time each day when your baby is normally most active.  HOW TO COUNT FETAL MOVEMENTS 8. Find a quiet and comfortable area to sit or lie down on your left side. Lying on your left side provides the best blood and oxygen circulation to your baby. 9. Write down the day and time on a sheet of paper or in a journal. 10. Start counting kicks, flutters, swishes, rolls, or jabs in a 2-hour period. You should feel at least 10 movements within 2 hours. 11. If you do not feel 10 movements in 2 hours, wait 2-3 hours and count again. Look for a change in the pattern or not enough counts in 2 hours. SEEK MEDICAL CARE IF:  You feel less than 10 counts in 2 hours, tried twice.  There is no movement in over an hour.  The pattern is changing or taking longer each day to reach 10 counts in 2 hours.  You feel the baby is not moving as he or she usually does. Date: ____________ Movements: ____________ Start time: ____________ Elizebeth Koller time: ____________  Date: ____________ Movements: ____________ Start time: ____________ Elizebeth Koller time: ____________ Date: ____________ Movements: ____________ Start time: ____________ Elizebeth Koller time: ____________ Date: ____________ Movements: ____________ Start time: ____________ Elizebeth Koller time: ____________ Date: ____________ Movements: ____________ Start time: ____________ Elizebeth Koller time: ____________ Date: ____________ Movements: ____________ Start time: ____________ Elizebeth Koller time: ____________ Date: ____________ Movements: ____________ Start time: ____________ Elizebeth Koller time: ____________ Date: ____________ Movements: ____________ Start time: ____________ Elizebeth Koller time: ____________  Date: ____________ Movements: ____________ Start time: ____________ Elizebeth Koller time: ____________ Date: ____________ Movements: ____________ Start time: ____________ Elizebeth Koller time: ____________ Date: ____________ Movements: ____________ Start time:  ____________ Elizebeth Koller time: ____________ Date: ____________ Movements: ____________ Start time: ____________ Elizebeth Koller time: ____________ Date: ____________ Movements: ____________ Start time: ____________ Elizebeth Koller time: ____________ Date: ____________ Movements: ____________ Start time: ____________ Elizebeth Koller time: ____________ Date: ____________ Movements: ____________ Start time: ____________ Elizebeth Koller time: ____________  Date: ____________ Movements: ____________ Start time: ____________ Elizebeth Koller time: ____________ Date: ____________ Movements: ____________ Start time: ____________ Elizebeth Koller time: ____________ Date: ____________ Movements: ____________ Start time: ____________ Elizebeth Koller time: ____________ Date: ____________ Movements: ____________ Start time: ____________ Elizebeth Koller time: ____________ Date: ____________ Movements: ____________ Start time: ____________ Elizebeth Koller time: ____________ Date: ____________ Movements: ____________ Start time: ____________ Elizebeth Koller time: ____________ Date: ____________ Movements: ____________ Start time: ____________ Elizebeth Koller time: ____________  Date: ____________ Movements: ____________ Start time: ____________ Elizebeth Koller time: ____________ Date: ____________ Movements: ____________ Start time: ____________ Elizebeth Koller time: ____________ Date: ____________ Movements: ____________ Start time: ____________ Elizebeth Koller time: ____________ Date: ____________ Movements: ____________ Start time: ____________ Elizebeth Koller time: ____________ Date: ____________ Movements: ____________ Start time: ____________ Elizebeth Koller time: ____________ Date: ____________ Movements: ____________ Start time: ____________ Elizebeth Koller time: ____________ Date: ____________ Movements: ____________ Start time: ____________ Elizebeth Koller time: ____________  Date: ____________ Movements: ____________ Start time: ____________ Elizebeth Koller time: ____________ Date: ____________ Movements: ____________ Start time: ____________ Elizebeth Koller time: ____________ Date:  ____________ Movements: ____________ Start time: ____________ Elizebeth Koller time: ____________ Date: ____________  Movements: ____________ Start time: ____________ Elizebeth Koller time: ____________ Date: ____________ Movements: ____________ Start time: ____________ Elizebeth Koller time: ____________ Date: ____________ Movements: ____________ Start time: ____________ Elizebeth Koller time: ____________ Date: ____________ Movements: ____________ Start time: ____________ Elizebeth Koller time: ____________  Date: ____________ Movements: ____________ Start time: ____________ Elizebeth Koller time: ____________ Date: ____________ Movements: ____________ Start time: ____________ Elizebeth Koller time: ____________ Date: ____________ Movements: ____________ Start time: ____________ Elizebeth Koller time: ____________ Date: ____________ Movements: ____________ Start time: ____________ Elizebeth Koller time: ____________ Date: ____________ Movements: ____________ Start time: ____________ Elizebeth Koller time: ____________ Date: ____________ Movements: ____________ Start time: ____________ Elizebeth Koller time: ____________ Date: ____________ Movements: ____________ Start time: ____________ Elizebeth Koller time: ____________  Date: ____________ Movements: ____________ Start time: ____________ Elizebeth Koller time: ____________ Date: ____________ Movements: ____________ Start time: ____________ Elizebeth Koller time: ____________ Date: ____________ Movements: ____________ Start time: ____________ Elizebeth Koller time: ____________ Date: ____________ Movements: ____________ Start time: ____________ Elizebeth Koller time: ____________ Date: ____________ Movements: ____________ Start time: ____________ Elizebeth Koller time: ____________ Date: ____________ Movements: ____________ Start time: ____________ Elizebeth Koller time: ____________ Date: ____________ Movements: ____________ Start time: ____________ Elizebeth Koller time: ____________  Date: ____________ Movements: ____________ Start time: ____________ Elizebeth Koller time: ____________ Date: ____________ Movements: ____________ Start  time: ____________ Elizebeth Koller time: ____________ Date: ____________ Movements: ____________ Start time: ____________ Elizebeth Koller time: ____________ Date: ____________ Movements: ____________ Start time: ____________ Elizebeth Koller time: ____________ Date: ____________ Movements: ____________ Start time: ____________ Elizebeth Koller time: ____________ Date: ____________ Movements: ____________ Start time: ____________ Elizebeth Koller time: ____________ Document Released: 06/02/2006 Document Revised: 09/17/2013 Document Reviewed: 02/28/2012 [employer] Patient Information 2015 [employer]. This information is not intended to replace advice given to you by your health care provider. Make sure you discuss any questions you have with your health care provider.

## 2014-05-13 ENCOUNTER — Telehealth (HOSPITAL_COMMUNITY): Payer: Self-pay | Admitting: *Deleted

## 2014-05-13 NOTE — Telephone Encounter (Signed)
Preadmission screen  

## 2014-05-14 ENCOUNTER — Inpatient Hospital Stay (HOSPITAL_COMMUNITY): Payer: Medicaid Other | Admitting: Anesthesiology

## 2014-05-14 ENCOUNTER — Inpatient Hospital Stay (HOSPITAL_COMMUNITY)
Admission: RE | Admit: 2014-05-14 | Discharge: 2014-05-18 | DRG: 766 | Disposition: A | Discharge status: Home / Self Care | Payer: Medicaid Other | Source: Ambulatory Visit | Attending: Obstetrics and Gynecology | Admitting: Obstetrics and Gynecology

## 2014-05-14 ENCOUNTER — Encounter (HOSPITAL_COMMUNITY): Payer: Self-pay

## 2014-05-14 DIAGNOSIS — D649 Anemia, unspecified: Secondary | ICD-10-CM | POA: Diagnosis present

## 2014-05-14 DIAGNOSIS — Z3A41 41 weeks gestation of pregnancy: Secondary | ICD-10-CM | POA: Diagnosis present

## 2014-05-14 DIAGNOSIS — Z2251 Carrier of viral hepatitis B: Secondary | ICD-10-CM | POA: Diagnosis not present

## 2014-05-14 DIAGNOSIS — O99824 Streptococcus B carrier state complicating childbirth: Secondary | ICD-10-CM | POA: Diagnosis present

## 2014-05-14 DIAGNOSIS — O9081 Anemia of the puerperium: Secondary | ICD-10-CM | POA: Diagnosis present

## 2014-05-14 DIAGNOSIS — Z98891 History of uterine scar from previous surgery: Secondary | ICD-10-CM

## 2014-05-14 DIAGNOSIS — O480 Post-term pregnancy: Secondary | ICD-10-CM | POA: Diagnosis present

## 2014-05-14 DIAGNOSIS — B951 Streptococcus, group B, as the cause of diseases classified elsewhere: Secondary | ICD-10-CM | POA: Diagnosis present

## 2014-05-14 LAB — CBC
HEMATOCRIT: 37.6 % (ref 36.0–46.0)
Hemoglobin: 13.1 g/dL (ref 12.0–15.0)
MCH: 29.4 pg (ref 26.0–34.0)
MCHC: 34.8 g/dL (ref 30.0–36.0)
MCV: 84.3 fL (ref 78.0–100.0)
Platelets: 206 10*3/uL (ref 150–400)
RBC: 4.46 MIL/uL (ref 3.87–5.11)
RDW: 14.3 % (ref 11.5–15.5)
WBC: 6.1 10*3/uL (ref 4.0–10.5)

## 2014-05-14 LAB — HIV ANTIBODY (ROUTINE TESTING W REFLEX): HIV 1&2 Ab, 4th Generation: NONREACTIVE

## 2014-05-14 LAB — RPR

## 2014-05-14 MED ORDER — PHENYLEPHRINE 40 MCG/ML (10ML) SYRINGE FOR IV PUSH (FOR BLOOD PRESSURE SUPPORT)
80.0000 ug | PREFILLED_SYRINGE | INTRAVENOUS | Status: DC | PRN
  Administered 2014-05-14: 80 ug via INTRAVENOUS

## 2014-05-14 MED ORDER — OXYTOCIN BOLUS FROM INFUSION: 500.0000 mL | INTRAVENOUS | Status: DC

## 2014-05-14 MED ORDER — ONDANSETRON HCL 4 MG/2ML IJ SOLN
4.0000 mg | Freq: Four times a day (QID) | INTRAMUSCULAR | Status: DC | PRN
  Administered 2014-05-14: 4 mg via INTRAVENOUS
  Filled 2014-05-14: qty 2

## 2014-05-14 MED ORDER — OXYTOCIN 40 UNITS IN LACTATED RINGERS INFUSION - SIMPLE MED: 62.5000 mL/h | INTRAVENOUS | Status: DC

## 2014-05-14 MED ORDER — OXYTOCIN 40 UNITS IN LACTATED RINGERS INFUSION - SIMPLE MED: 1.0000 m[IU]/min | INTRAVENOUS | Status: DC

## 2014-05-14 MED ORDER — FENTANYL 2.5 MCG/ML BUPIVACAINE 1/10 % EPIDURAL INFUSION (WH - ANES)
14.0000 mL/h | INTRAMUSCULAR | Status: DC | PRN
  Filled 2014-05-14: qty 125

## 2014-05-14 MED ORDER — NALBUPHINE HCL 10 MG/ML IJ SOLN: 10.0000 mg | INTRAMUSCULAR | Status: DC | PRN

## 2014-05-14 MED ORDER — DIPHENHYDRAMINE HCL 50 MG/ML IJ SOLN: 12.5000 mg | INTRAMUSCULAR | Status: DC | PRN

## 2014-05-14 MED ORDER — MISOPROSTOL 25 MCG QUARTER TABLET
25.0000 ug | ORAL_TABLET | ORAL | Status: DC
  Administered 2014-05-14: 25 ug via VAGINAL
  Filled 2014-05-14 (×3): qty 1
  Filled 2014-05-14: qty 0.25

## 2014-05-14 MED ORDER — LACTATED RINGERS IV SOLN
500.0000 mL | INTRAVENOUS | Status: DC | PRN
  Administered 2014-05-14: 300 mL via INTRAVENOUS

## 2014-05-14 MED ORDER — TERBUTALINE SULFATE 1 MG/ML IJ SOLN
0.2500 mg | Freq: Once | INTRAMUSCULAR | Status: AC | PRN
Start: 2014-05-14 — End: 2014-05-14
  Administered 2014-05-14: 0.25 mg via SUBCUTANEOUS
  Filled 2014-05-14: qty 1

## 2014-05-14 MED ORDER — LACTATED RINGERS IV SOLN
INTRAVENOUS | Status: DC
  Administered 2014-05-14: via INTRAUTERINE

## 2014-05-14 MED ORDER — OXYTOCIN 40 UNITS IN LACTATED RINGERS INFUSION - SIMPLE MED
1.0000 m[IU]/min | INTRAVENOUS | Status: DC
  Administered 2014-05-14: 2 m[IU]/min via INTRAVENOUS
  Filled 2014-05-14: qty 1000

## 2014-05-14 MED ORDER — FENTANYL 2.5 MCG/ML BUPIVACAINE 1/10 % EPIDURAL INFUSION (WH - ANES)
INTRAMUSCULAR | Status: DC | PRN
  Administered 2014-05-14: 14 mL/h via EPIDURAL

## 2014-05-14 MED ORDER — LIDOCAINE HCL (PF) 1 % IJ SOLN
INTRAMUSCULAR | Status: DC | PRN
  Administered 2014-05-14 (×2): 8 mL

## 2014-05-14 MED ORDER — SODIUM BICARBONATE 8.4 % IV SOLN
INTRAVENOUS | Status: DC | PRN
  Administered 2014-05-14: 5 mL via EPIDURAL
  Administered 2014-05-15: 3 mL via EPIDURAL

## 2014-05-14 MED ORDER — OXYCODONE-ACETAMINOPHEN 5-325 MG PO TABS: 1.0000 | ORAL_TABLET | ORAL | Status: DC | PRN

## 2014-05-14 MED ORDER — OXYCODONE-ACETAMINOPHEN 5-325 MG PO TABS: 2.0000 | ORAL_TABLET | ORAL | Status: DC | PRN

## 2014-05-14 MED ORDER — EPHEDRINE 5 MG/ML INJ: 10.0000 mg | INTRAVENOUS | Status: DC | PRN

## 2014-05-14 MED ORDER — LACTATED RINGERS IV SOLN
500.0000 mL | Freq: Once | INTRAVENOUS | Status: AC
  Administered 2014-05-14: 1000 mL via INTRAVENOUS

## 2014-05-14 MED ORDER — LIDOCAINE HCL (PF) 1 % IJ SOLN: 30.0000 mL | INTRAMUSCULAR | Status: DC | PRN

## 2014-05-14 MED ORDER — DEXTROSE 5 % IV SOLN
2.5000 10*6.[IU] | INTRAVENOUS | Status: DC
  Administered 2014-05-14 (×3): 2.5 10*6.[IU] via INTRAVENOUS
  Filled 2014-05-14 (×8): qty 2.5

## 2014-05-14 MED ORDER — LACTATED RINGERS IV SOLN
INTRAVENOUS | Status: DC
  Administered 2014-05-14 (×3): via INTRAVENOUS

## 2014-05-14 MED ORDER — PHENYLEPHRINE 40 MCG/ML (10ML) SYRINGE FOR IV PUSH (FOR BLOOD PRESSURE SUPPORT)
80.0000 ug | PREFILLED_SYRINGE | INTRAVENOUS | Status: DC | PRN
  Filled 2014-05-14 (×3): qty 10

## 2014-05-14 MED ORDER — PENICILLIN G POTASSIUM 5000000 UNITS IJ SOLR
5.0000 10*6.[IU] | Freq: Once | INTRAVENOUS | Status: AC
  Administered 2014-05-14: 5 10*6.[IU] via INTRAVENOUS
  Filled 2014-05-14: qty 5

## 2014-05-14 MED ORDER — ACETAMINOPHEN 325 MG PO TABS: 650.0000 mg | ORAL_TABLET | ORAL | Status: DC | PRN

## 2014-05-14 MED ORDER — CITRIC ACID-SODIUM CITRATE 334-500 MG/5ML PO SOLN
30.0000 mL | ORAL | Status: DC | PRN
  Administered 2014-05-15: 30 mL via ORAL
  Filled 2014-05-14: qty 15

## 2014-05-14 NOTE — Progress Notes (Signed)
Erica Hurst MRN: [id number]  Subjective: -Patient resting in bed.  Comfortable with epidural. In room to assess after prolonged decel.   Objective: BP 114/80 mmHg  Pulse 86  Temp(Src) 98.1 F (36.7 C) (Oral)  Resp 18  Ht 5\' 6"  (1.676 m)  Wt 173 lb (78.472 kg)  BMI 27.94 kg/m2  SpO2 100%     FHT:134 bpm, Mod Var, +Prolonged Decels, +Accels UC:   Q2-10min, palpates moderate SVE:   Dilation: 5 Effacement (%): 50 Station: -2 Exam by:: Milinda Cave, CNM Membranes: AROM at 1720 Pitocin: Initiate  Assessment:  IUP at 41.3wks Cat II FT  GBS Positive Pitocin Augmentation  Plan: -Cat II FT resolved with position change  -Start pitocin for augmentation -Continue other mgmt as ordered  Erica Hipolito LYNN,MSN, CNM 05/14/2014, 7:49 PM

## 2014-05-14 NOTE — Anesthesia Procedure Notes (Signed)
Epidural Patient location during procedure: OB Start time: 05/14/2014 6:17 PM End time: 05/14/2014 6:21 PM  Staffing Anesthesiologist: Lyn Hollingshead Performed by: anesthesiologist   Preanesthetic Checklist Completed: patient identified, surgical consent, pre-op evaluation, timeout performed, IV checked, risks and benefits discussed and monitors and equipment checked  Epidural Patient position: sitting Prep: site prepped and draped and DuraPrep Patient monitoring: continuous pulse ox and blood pressure Approach: midline Location: L3-L4 Injection technique: LOR air  Needle:  Needle type: Tuohy  Needle gauge: 17 G Needle length: 9 cm and 9 Needle insertion depth: 6 cm Catheter type: closed end flexible Catheter size: 19 Gauge Catheter at skin depth: 11 cm Test dose: negative and Other  Assessment Sensory level: T9 Events: blood not aspirated, injection not painful, no injection resistance, negative IV test and no paresthesia

## 2014-05-14 NOTE — Anesthesia Preprocedure Evaluation (Signed)

## 2014-05-14 NOTE — Progress Notes (Signed)
Erica Hurst MRN: [id number]  Subjective: -Patient reports some mild cramping.    Objective: BP 103/58 mmHg  Pulse 73  Temp(Src) 97.8 F (36.6 C) (Axillary)  Resp 18  Ht 5\' 6"  (1.676 m)  Wt 173 lb (78.472 kg)  BMI 27.94 kg/m2     FHT:  140 bpm, Mod Var, -Decels, +Accels UC:   Q2-78min, palpates mild to moderate SVE:   Dilation: 1 Effacement (%): thick Station: Pensions consultant Exam by:: [name], CNM Membranes: Intact Pitocin:None Cooks Catheter Inserted:40/40   Assessment:  IUP at 41.3wks Cat I FT  GBS Positive Post Dates  Cervical Ripening Bishop Score: 2  Plan: -Cooks catheter inserted without difficulty -Will assess once catheter out or in 6 hrs -Will continue to observe -Continue other mgmt as ordered -Dr. Octavio Manns updated on patient status  Erica Esh LYNN,MSN, CNM 05/14/2014, 2:00 PM

## 2014-05-14 NOTE — Progress Notes (Addendum)
Erica Hurst MRN: [id number]  Subjective: -Cooks catheter spontaneously expelled.  Patient reports increased discomfort.  Husband remains at bedside.   Objective: BP 103/58 mmHg  Pulse 73  Temp(Src) 97.8 F (36.6 C) (Axillary)  Resp 18  Ht 5\' 6"  (1.676 m)  Wt 173 lb (78.472 kg)  BMI 27.94 kg/m2     FHT: 125 bpm, Mod Var, + Variable Decels, +Accels UC:  Q1-48mins, palpates moderate  SVE:   Dilation: 5 Effacement (%): 50 Station: -2 Exam by:: Erica Hurst, CNM Membranes:AROM at 1720 Pitocin:None IUPC inserted  Assessment:  IUP at 41.3wks Cat II FT  Post Dates Amniotomy  Plan: -Discussed and patient agrees to amniotomy and insertion of IUPC -Patient tolerated procedure well -Will withhold pitocin augmentation until after epidural -Okay for epidural now -Will continue to monitor -Continue other mgmt as ordered  Erica Yin LYNN,MSN, CNM 05/14/2014, 5:34 PM

## 2014-05-14 NOTE — Progress Notes (Signed)
Erica Hurst, [id number]   Subjective -Patient comfortable.  PCN infusing.  Patient denies contractions, LOF, and VB.  Reports active fetus.  Husband at bedside acting as interpreter.   Objective Filed Vitals:   05/14/14 0922  BP: 109/77  Pulse: 90  Temp:   Resp: 18        Physical Exam  Constitutional: She is oriented to person, place, and time and well-developed, well-nourished, and in no distress. No distress.  Cardiovascular: Normal rate and regular rhythm.   Pulmonary/Chest: Effort normal.  Abdominal: Soft.  Appears gravid--fundal height AGA, Soft, NT   Musculoskeletal: Normal range of motion.  Neurological: She is alert and oriented to person, place, and time.  Skin: Skin is warm and dry.    MVV:KPQAESLP: 1 Effacement (%): Thick Cervical Position: Posterior Station: Ballotable Presentation: Vertex Exam by:: [name], CNM  Pelvis: -Proven:No -Adequate: Yes Leopolds: -Position:Vertex -EFW:7lbs to 7 3/4lbs  FHR: 140 bpm, Mod Var, -Decels, +Accels UC:  Mild palpated, graphs irregular  Induction/Augmentation Agent: Pitocin: None Cytotec: 1st Dose at 0945 by Gwenette Greet, RN Membranes: Intact  Assessment IUP at 41.3wks Cat I FT Bishop Score: 2 Cervical Ripening  Plan -Discussed r/b of induction including fetal distress, serial induction, pain, and increased risk of c/s delivery -Discussed induction methods including cervical ripening agents, foley bulbs, and pitocin -Questions and concerns addressed -Continue other mgmt as ordered   Emlyn Maves LYNN, CNM 05/14/2014, 9:31 AM

## 2014-05-14 NOTE — H&P (Signed)
Erica Hurst is a 33 y.o. female, G3P0020 at 41.3 weeks, presenting for IOL for post dates.  Patient is GBS positive and is a chronic Hepatitis B Carrier.  She is negative for all other testing.  Patient was last evaluated on 05/13/2014 and had no questions or concerns.    Patient Active Problem List   Diagnosis Date Noted  . Post-dates pregnancy 05/14/2014  . Hepatitis B affecting pregnancy in first trimester 11/23/2013    History of present pregnancy: Patient entered care at 16.4 weeks.   EDC of 05/04/2014 was established by Definite LMP of 05/04/2014.   Anatomy scan:  18.3 weeks, with normal findings and an anterior placenta.   Additional [place] evaluations:   -Anatomy: EFW 8oz, cervical length 4.59cm, anterior placenta, cervix closed, female. Significant prenatal events: 2nd Trimester: Patient c/o numbness/tingling in lower extremities. 3rd Trimester: Slip and fell, body did not hit ground. C/O Headaches, pressure, and urinary frequency Last evaluation:  05/13/2014 by Dr. Carmon Sails at 41.2wks FHR 144  BP 100/60, Wt 173lbs  OB History    Gravida Para Term Preterm AB TAB SAB Ectopic Multiple Living   3 0 0 0 2 0 2 0 0 0      Past Medical History  Diagnosis Date  . Hepatitis B affecting pregnancy   . Fibroid    Past Surgical History  Procedure Laterality Date  . Dilitation & currettage/hystroscopy with versapoint resection N/A 06/30/2012    Procedure: DILATATION & CURETTAGE/HYSTEROSCOPY WITH VERSAPOINT RESECTION;  Surgeon: Princess Bruins, MD;  Location: [facility];  Service: Gynecology;  Laterality: N/A;   Family History: family history is negative for Liver disease. Social History:  reports that she has never smoked. She does not have any smokeless tobacco history on file. She reports that she does not drink alcohol or use illicit drugs.   Prenatal Transfer Tool  Maternal Diabetes: No Genetic Screening: Normal Maternal Ultrasounds/Referrals: Normal Fetal Ultrasounds or other  Referrals:  None Maternal Substance Abuse:  No Significant Maternal Medications:  Meds include: Other: PNV Significant Maternal Lab Results: Lab values include: Group B Strep positive    ROS:  No complaints at last PNV  No Known Allergies     There were no vitals taken for this visit.  FHR: 135 bpm, Mod Var, -Decels, +Accels UCs:  None Graphed  Prenatal labs: ABO, Rh: A/Positive/-- (06/24 0000) Antibody: Negative (06/24 0000) Rubella:   Immune RPR: Nonreactive (06/24 0000)  HBsAg: Positive (06/24 0000)  HIV: Non-reactive (06/24 0000)  GBS: Positive (07/08 0000) Sickle cell/Hgb electrophoresis:  Normal Pap:  Negative GC:  Negative Chlamydia:  Negative Genetic screenings:  Negative Glucola:  Normal Other:  N/A    Assessment IUP at 41.3wks Cat I FT Post Dates GBS Positive Hep B Carrier Induction of Labor  Plan: Admit to [facility] per consult with Dr. Octavio Manns Routine Labor and Delivery Orders per [facility] Protocol Will evaluate for induction method Routine Induction/Augmentation Orders  Kandis Fantasia, MSN 05/14/2014, 8:09 AM

## 2014-05-14 NOTE — Progress Notes (Addendum)
Doneisha Ivey MRN: [id number]  Subjective: -Nurse call reporting prolonged, late decelerations.  Mother comfortable with epidural.  Husband remains at bedside.   Objective: BP 140/86 mmHg  Pulse 81  Temp(Src) 98.7 F (37.1 C) (Axillary)  Resp 18  Ht 5\' 6"  (1.676 m)  Wt 173 lb (78.472 kg)  BMI 27.94 kg/m2  SpO2 100%     FHT: 135 bpm, Mod Var, +Decels, +Accels UC:   Q1-64min, palpates mild SVE:   Dilation: 5 Effacement (%): 50 Station: -2 Exam by:: J. Caragh Gasper, CNM Membranes:AROM x 4hrs Pitocin:Discontinued FSE Appplied IUPC in place  Assessment:  IUP at 41.3wks Cat II FT  Pitocin Augmentation  Plan: -Position change, O2, and Fluid Bolus given with no resolution of Cat II FT -Terbutaline given and Cat II resolved -Dr. Octavio Manns updated on patient status and advised as below: -Will allow for patient to rest and then will restart pitocin at 59mUn/min  -Continue other mgmt as ordered  [facility], [place] Roughton LYNN,MSN, CNM 05/14/2014, 9:30 PM

## 2014-05-14 NOTE — Progress Notes (Signed)
Chelly Dombeck MRN: [id number]  Subjective: -Nurse call reporting prolonged late decelerations.  Instructed to treat hypotension.  Provider to room to assess.   Objective: BP 102/65 mmHg  Pulse 177  Temp(Src) 99 F (37.2 C) (Oral)  Resp 20  Ht 5\' 6"  (1.676 m)  Wt 173 lb (78.472 kg)  BMI 27.94 kg/m2  SpO2 100%   Total I/O In: -  Out: 450 [Urine:450] FHT: 135 bpm, Mod Var, + Decels, +Accels UC:  Q2-29min, MVUs 150 SVE:   Dilation: 5 Effacement (%): 50 Station: -2 Exam by:: Milinda Cave, CNM Membranes:AROM at 1720 Pitocin:8mUn/min to off FSE and IUPC in place  Assessment:  IUP at 41.3wks Cat II FT  GBS Positive  Plan: -Position change and pitocin off to resolve Cat II FT -Dr. Octavio Manns consulted and advised as below -Start amnioinfusion -Monitor and determine adequate UC -If MVUs <180, start pitocin at 60mUn/min and observe -Continue other mgmt as ordered  [facility], Tomicka Lover LYNN,MSN, CNM 05/14/2014, 11:39 PM  Addendum: S: Dr. Mancel Bale called reporting deceleration occurring.  Requests that patient be consented and prepped for c/s.   O: VSS FHR: 135 bpm, Mod Var, + Decels, +Accels UC: q14mins, MVUs 146mmHg A: IUP at 41.3wks Cat II FT Fetal Intolerance to Labor Induction P: Discussed r/b of c/s including but not limited to bleeding, infection, injury to other organs or fetus requiring need for additional surgery Husband acting as interpreter.  Patient and husband without any questions.  Consents signed. Dr. Octavio Manns updated and en route to hospital   J.Niles Ess, CNM

## 2014-05-15 ENCOUNTER — Encounter (HOSPITAL_COMMUNITY): Payer: Self-pay

## 2014-05-15 ENCOUNTER — Encounter (HOSPITAL_COMMUNITY)
Admission: RE | Disposition: A | Discharge status: Home / Self Care | Payer: Self-pay | Source: Ambulatory Visit | Attending: Obstetrics and Gynecology

## 2014-05-15 LAB — CBC
HEMATOCRIT: 31.3 % — AB (ref 36.0–46.0)
Hemoglobin: 10.8 g/dL — ABNORMAL LOW (ref 12.0–15.0)
MCH: 29.3 pg (ref 26.0–34.0)
MCHC: 34.5 g/dL (ref 30.0–36.0)
MCV: 84.8 fL (ref 78.0–100.0)
Platelets: 188 10*3/uL (ref 150–400)
RBC: 3.69 MIL/uL — ABNORMAL LOW (ref 3.87–5.11)
RDW: 14.3 % (ref 11.5–15.5)
WBC: 18.4 10*3/uL — ABNORMAL HIGH (ref 4.0–10.5)

## 2014-05-15 LAB — TYPE AND SCREEN
ABO/RH(D): A POS
ANTIBODY SCREEN: NEGATIVE

## 2014-05-15 LAB — ABO/RH: ABO/RH(D): A POS

## 2014-05-15 SURGERY — Surgical Case
Anesthesia: Epidural | Site: Abdomen

## 2014-05-15 MED ORDER — LANOLIN HYDROUS EX OINT: 1.0000 "application " | TOPICAL_OINTMENT | CUTANEOUS | Status: DC | PRN

## 2014-05-15 MED ORDER — MEPERIDINE HCL 25 MG/ML IJ SOLN
INTRAMUSCULAR | Status: AC
  Filled 2014-05-15: qty 1

## 2014-05-15 MED ORDER — OXYTOCIN 10 UNIT/ML IJ SOLN
INTRAMUSCULAR | Status: DC | PRN
  Administered 2014-05-15: 40 [IU] via INTRAMUSCULAR

## 2014-05-15 MED ORDER — CEFAZOLIN SODIUM-DEXTROSE 2-3 GM-% IV SOLR
INTRAVENOUS | Status: AC
  Filled 2014-05-15: qty 50

## 2014-05-15 MED ORDER — DEXAMETHASONE SODIUM PHOSPHATE 10 MG/ML IJ SOLN
INTRAMUSCULAR | Status: DC | PRN
  Administered 2014-05-15: 10 mg via INTRAVENOUS

## 2014-05-15 MED ORDER — ONDANSETRON HCL 4 MG/2ML IJ SOLN: 4.0000 mg | Freq: Three times a day (TID) | INTRAMUSCULAR | Status: DC | PRN

## 2014-05-15 MED ORDER — SCOPOLAMINE 1 MG/3DAYS TD PT72
1.0000 | MEDICATED_PATCH | Freq: Once | TRANSDERMAL | Status: AC
  Administered 2014-05-15: 1.5 mg via TRANSDERMAL
  Filled 2014-05-15: qty 1

## 2014-05-15 MED ORDER — DIPHENHYDRAMINE HCL 50 MG/ML IJ SOLN: 12.5000 mg | INTRAMUSCULAR | Status: DC | PRN

## 2014-05-15 MED ORDER — OXYCODONE-ACETAMINOPHEN 5-325 MG PO TABS
1.0000 | ORAL_TABLET | ORAL | Status: DC | PRN
  Administered 2014-05-18 (×2): 1 via ORAL
  Filled 2014-05-15 (×2): qty 1

## 2014-05-15 MED ORDER — ONDANSETRON HCL 4 MG/2ML IJ SOLN: 4.0000 mg | INTRAMUSCULAR | Status: DC | PRN

## 2014-05-15 MED ORDER — SENNOSIDES-DOCUSATE SODIUM 8.6-50 MG PO TABS
2.0000 | ORAL_TABLET | ORAL | Status: DC
  Administered 2014-05-16 – 2014-05-17 (×3): 2 via ORAL
  Filled 2014-05-15 (×3): qty 2

## 2014-05-15 MED ORDER — TETANUS-DIPHTH-ACELL PERTUSSIS 5-2.5-18.5 LF-MCG/0.5 IM SUSP: 0.5000 mL | Freq: Once | INTRAMUSCULAR | Status: DC

## 2014-05-15 MED ORDER — MORPHINE SULFATE (PF) 0.5 MG/ML IJ SOLN
INTRAMUSCULAR | Status: DC | PRN
  Administered 2014-05-15: 3 mg via EPIDURAL

## 2014-05-15 MED ORDER — IBUPROFEN 600 MG PO TABS
600.0000 mg | ORAL_TABLET | Freq: Four times a day (QID) | ORAL | Status: DC
  Administered 2014-05-15 – 2014-05-18 (×14): 600 mg via ORAL
  Filled 2014-05-15 (×14): qty 1

## 2014-05-15 MED ORDER — PHENYLEPHRINE 40 MCG/ML (10ML) SYRINGE FOR IV PUSH (FOR BLOOD PRESSURE SUPPORT)
PREFILLED_SYRINGE | INTRAVENOUS | Status: AC
  Filled 2014-05-15: qty 5

## 2014-05-15 MED ORDER — SODIUM BICARBONATE 8.4 % IV SOLN
INTRAVENOUS | Status: AC
  Filled 2014-05-15: qty 50

## 2014-05-15 MED ORDER — MENTHOL 3 MG MT LOZG: 1.0000 | LOZENGE | OROMUCOSAL | Status: DC | PRN

## 2014-05-15 MED ORDER — MEPERIDINE HCL 25 MG/ML IJ SOLN
INTRAMUSCULAR | Status: DC | PRN
  Administered 2014-05-15: 12.5 mg via INTRAVENOUS

## 2014-05-15 MED ORDER — NALBUPHINE HCL 10 MG/ML IJ SOLN
5.0000 mg | INTRAMUSCULAR | Status: DC | PRN
  Administered 2014-05-15: 5 mg via INTRAVENOUS
  Filled 2014-05-15: qty 1

## 2014-05-15 MED ORDER — LACTATED RINGERS IV SOLN
INTRAVENOUS | Status: DC | PRN
  Administered 2014-05-15 (×2): via INTRAVENOUS

## 2014-05-15 MED ORDER — PRENATAL MULTIVITAMIN CH
1.0000 | ORAL_TABLET | Freq: Every day | ORAL | Status: DC
  Administered 2014-05-15 – 2014-05-18 (×4): 1 via ORAL
  Filled 2014-05-15 (×4): qty 1

## 2014-05-15 MED ORDER — SODIUM CHLORIDE 0.9 % IJ SOLN: 3.0000 mL | INTRAMUSCULAR | Status: DC | PRN

## 2014-05-15 MED ORDER — CEFAZOLIN SODIUM-DEXTROSE 2-3 GM-% IV SOLR
INTRAVENOUS | Status: DC | PRN
  Administered 2014-05-15: 2 g via INTRAVENOUS

## 2014-05-15 MED ORDER — ONDANSETRON HCL 4 MG/2ML IJ SOLN
INTRAMUSCULAR | Status: AC
  Filled 2014-05-15: qty 2

## 2014-05-15 MED ORDER — LIDOCAINE-EPINEPHRINE (PF) 2 %-1:200000 IJ SOLN
INTRAMUSCULAR | Status: AC
  Filled 2014-05-15: qty 20

## 2014-05-15 MED ORDER — NALBUPHINE HCL 10 MG/ML IJ SOLN
5.0000 mg | Freq: Once | INTRAMUSCULAR | Status: AC | PRN
  Administered 2014-05-15: 5 mg via INTRAVENOUS

## 2014-05-15 MED ORDER — SIMETHICONE 80 MG PO CHEW
80.0000 mg | CHEWABLE_TABLET | Freq: Three times a day (TID) | ORAL | Status: DC
  Administered 2014-05-15 – 2014-05-18 (×8): 80 mg via ORAL
  Filled 2014-05-15 (×8): qty 1

## 2014-05-15 MED ORDER — DIPHENHYDRAMINE HCL 25 MG PO CAPS
25.0000 mg | ORAL_CAPSULE | Freq: Four times a day (QID) | ORAL | Status: DC | PRN
  Filled 2014-05-15: qty 1

## 2014-05-15 MED ORDER — ONDANSETRON HCL 4 MG/2ML IJ SOLN
INTRAMUSCULAR | Status: DC | PRN
  Administered 2014-05-15: 4 mg via INTRAVENOUS

## 2014-05-15 MED ORDER — NALOXONE HCL 0.4 MG/ML IJ SOLN: 0.4000 mg | INTRAMUSCULAR | Status: DC | PRN

## 2014-05-15 MED ORDER — SIMETHICONE 80 MG PO CHEW: 80.0000 mg | CHEWABLE_TABLET | ORAL | Status: DC | PRN

## 2014-05-15 MED ORDER — FENTANYL CITRATE 0.05 MG/ML IJ SOLN: 25.0000 ug | INTRAMUSCULAR | Status: DC | PRN

## 2014-05-15 MED ORDER — WITCH HAZEL-GLYCERIN EX PADS
1.0000 | MEDICATED_PAD | CUTANEOUS | Status: DC | PRN
Start: 2014-05-15 — End: 2014-05-18

## 2014-05-15 MED ORDER — SCOPOLAMINE 1 MG/3DAYS TD PT72
MEDICATED_PATCH | TRANSDERMAL | Status: AC
  Filled 2014-05-15: qty 1

## 2014-05-15 MED ORDER — OXYTOCIN 40 UNITS IN LACTATED RINGERS INFUSION - SIMPLE MED: 62.5000 mL/h | INTRAVENOUS | Status: AC

## 2014-05-15 MED ORDER — OXYTOCIN 10 UNIT/ML IJ SOLN: 40.0000 [IU] | INTRAMUSCULAR | Status: DC | PRN

## 2014-05-15 MED ORDER — NALBUPHINE HCL 10 MG/ML IJ SOLN: 5.0000 mg | INTRAMUSCULAR | Status: DC | PRN

## 2014-05-15 MED ORDER — LACTATED RINGERS IV SOLN
INTRAVENOUS | Status: DC
  Administered 2014-05-15: 09:00:00 via INTRAVENOUS

## 2014-05-15 MED ORDER — SIMETHICONE 80 MG PO CHEW
80.0000 mg | CHEWABLE_TABLET | ORAL | Status: DC
  Administered 2014-05-16 – 2014-05-17 (×3): 80 mg via ORAL
  Filled 2014-05-15 (×3): qty 1

## 2014-05-15 MED ORDER — NALBUPHINE HCL 10 MG/ML IJ SOLN: 5.0000 mg | Freq: Once | INTRAMUSCULAR | Status: AC | PRN

## 2014-05-15 MED ORDER — DIBUCAINE 1 % RE OINT: 1.0000 "application " | TOPICAL_OINTMENT | RECTAL | Status: DC | PRN

## 2014-05-15 MED ORDER — LACTATED RINGERS IV SOLN
INTRAVENOUS | Status: DC | PRN
  Administered 2014-05-15 (×4): via INTRAVENOUS

## 2014-05-15 MED ORDER — ONDANSETRON HCL 4 MG PO TABS: 4.0000 mg | ORAL_TABLET | ORAL | Status: DC | PRN

## 2014-05-15 MED ORDER — DEXAMETHASONE SODIUM PHOSPHATE 10 MG/ML IJ SOLN
INTRAMUSCULAR | Status: AC
  Filled 2014-05-15: qty 1

## 2014-05-15 MED ORDER — DIPHENHYDRAMINE HCL 25 MG PO CAPS
25.0000 mg | ORAL_CAPSULE | ORAL | Status: DC | PRN
  Administered 2014-05-16 (×3): 25 mg via ORAL
  Filled 2014-05-15 (×2): qty 1

## 2014-05-15 MED ORDER — OXYTOCIN 10 UNIT/ML IJ SOLN
INTRAMUSCULAR | Status: AC
Start: 2014-05-15 — End: 2014-05-15
  Filled 2014-05-15: qty 4

## 2014-05-15 MED ORDER — OXYCODONE-ACETAMINOPHEN 5-325 MG PO TABS: 2.0000 | ORAL_TABLET | ORAL | Status: DC | PRN

## 2014-05-15 MED ORDER — ZOLPIDEM TARTRATE 5 MG PO TABS: 5.0000 mg | ORAL_TABLET | Freq: Every evening | ORAL | Status: DC | PRN

## 2014-05-15 MED ORDER — MEPERIDINE HCL 25 MG/ML IJ SOLN: 6.2500 mg | INTRAMUSCULAR | Status: DC | PRN

## 2014-05-15 MED ORDER — NALOXONE HCL 1 MG/ML IJ SOLN
1.0000 ug/kg/h | INTRAVENOUS | Status: DC | PRN
  Filled 2014-05-15: qty 2

## 2014-05-15 MED ORDER — MORPHINE SULFATE 0.5 MG/ML IJ SOLN
INTRAMUSCULAR | Status: AC
  Filled 2014-05-15: qty 10

## 2014-05-15 SURGICAL SUPPLY — 35 items
BENZOIN TINCTURE PRP APPL 2/3 (GAUZE/BANDAGES/DRESSINGS) ×3 IMPLANT
CLAMP CORD UMBIL (MISCELLANEOUS) IMPLANT
CLOSURE WOUND 1/2 X4 (GAUZE/BANDAGES/DRESSINGS) ×1
CLOTH BEACON ORANGE TIMEOUT ST (SAFETY) ×3 IMPLANT
CONTAINER PREFILL 10% NBF 15ML (MISCELLANEOUS) IMPLANT
DRAPE SHEET LG 3/4 BI-LAMINATE (DRAPES) IMPLANT
DRSG OPSITE POSTOP 4X10 (GAUZE/BANDAGES/DRESSINGS) ×3 IMPLANT
DURAPREP 26ML APPLICATOR (WOUND CARE) ×3 IMPLANT
ELECT REM PT RETURN 9FT ADLT (ELECTROSURGICAL) ×3
ELECTRODE REM PT RTRN 9FT ADLT (ELECTROSURGICAL) ×1 IMPLANT
EXTRACTOR VACUUM M CUP 4 TUBE (SUCTIONS) IMPLANT
EXTRACTOR VACUUM M CUP 4' TUBE (SUCTIONS)
GLOVE BIO SURGEON STRL SZ7.5 (GLOVE) ×3 IMPLANT
GLOVE BIOGEL PI IND STRL 7.5 (GLOVE) ×1 IMPLANT
GLOVE BIOGEL PI INDICATOR 7.5 (GLOVE) ×2
GOWN STRL REUS W/TWL LRG LVL3 (GOWN DISPOSABLE) ×6 IMPLANT
KIT ABG SYR 3ML LUER SLIP (SYRINGE) IMPLANT
NEEDLE HYPO 25X5/8 SAFETYGLIDE (NEEDLE) IMPLANT
NS IRRIG 1000ML POUR BTL (IV SOLUTION) ×3 IMPLANT
PACK C SECTION WH (CUSTOM PROCEDURE TRAY) ×3 IMPLANT
PAD OB MATERNITY 4.3X12.25 (PERSONAL CARE ITEMS) ×3 IMPLANT
RETRACTOR WND ALEXIS 25 LRG (MISCELLANEOUS) ×1 IMPLANT
RTRCTR WOUND ALEXIS 25CM LRG (MISCELLANEOUS) ×3
SPONGE LAP 18X18 X RAY DECT (DISPOSABLE) ×3 IMPLANT
STRIP CLOSURE SKIN 1/2X4 (GAUZE/BANDAGES/DRESSINGS) ×2 IMPLANT
SUT CHROMIC 2 0 CT 1 (SUTURE) ×3 IMPLANT
SUT MNCRL AB 3-0 PS2 27 (SUTURE) ×3 IMPLANT
SUT PLAIN 0 NONE (SUTURE) IMPLANT
SUT PLAIN 2 0 XLH (SUTURE) ×3 IMPLANT
SUT VIC AB 0 CT1 36 (SUTURE) ×3 IMPLANT
SUT VIC AB 0 CTX 36 (SUTURE) ×8
SUT VIC AB 0 CTX36XBRD ANBCTRL (SUTURE) ×4 IMPLANT
TOWEL OR 17X24 6PK STRL BLUE (TOWEL DISPOSABLE) ×3 IMPLANT
TRAY FOLEY CATH 14FR (SET/KITS/TRAYS/PACK) IMPLANT
WATER STERILE IRR 1000ML POUR (IV SOLUTION) IMPLANT

## 2014-05-15 NOTE — Anesthesia Postprocedure Evaluation (Signed)
Anesthesia Post Note  Patient: Erica Hurst  Procedure(s) Performed: Procedure(s) (LRB): CESAREAN SECTION (N/A)  Anesthesia type: Epidural  Patient location: Mother/Baby  Post pain: Pain level controlled  Post assessment: Post-op Vital signs reviewed  Last Vitals:  Filed Vitals:   05/15/14 1217  BP: 103/67  Pulse: 73  Temp: 36.8 C  Resp: 18    Post vital signs: Reviewed  Level of consciousness:alert  Complications: No apparent anesthesia complications

## 2014-05-15 NOTE — Progress Notes (Signed)
Subjective: Postpartum Day 0: Cesarean Delivery due to NRFHT Patient up ad lib, reports no syncope or dizziness. Feeding:  Plan on breast, infant in NICU d/t tachypnea Contraceptive plan:  unsure  Objective: Vital signs in last 24 hours: Temp:  [97.6 F (36.4 C)-99 F (37.2 C)] 98.5 F (36.9 C) (12/30 0909) Pulse Rate:  [72-177] 88 (12/30 0914) Resp:  [18-28] 20 (12/30 0914) BP: (77-140)/(43-93) 115/66 mmHg (12/30 0914) SpO2:  [95 %-100 %] 97 % (12/30 0914)  Physical Exam:  General: alert and cooperative Lochia: appropriate Uterine Fundus: firm Abdomen:  min bowel sounds,  distended Incision: healing well, no significant drainage  Honeycomb dressing CDI, old dark blood on right side DVT Evaluation: No evidence of DVT seen on physical exam. Homan's sign: Negative   Recent Labs  05/14/14 0830 05/15/14 0645  HGB 13.1 10.8*  HCT 37.6 31.3*  WBC 6.1 18.4*    Assessment: Status post Cesarean section day 0. Doing well postoperatively.  Honeycomb dressing in place, no significant drainage Anemia - hemodynamicly stable.   Plan: Continue current care. Breastfeeding, breast pumping while infant in NICU    Jawana Reagor, CNM, MSN 05/15/2014. 10:17 AM

## 2014-05-15 NOTE — Transfer of Care (Signed)
Immediate Anesthesia Transfer of Care Note  Patient: Erica Hurst  Procedure(s) Performed: Procedure(s): CESAREAN SECTION (N/A)  Patient Location: PACU  Anesthesia Type:Epidural  Level of Consciousness: awake, alert , oriented and patient cooperative  Airway & Oxygen Therapy: Patient Spontanous Breathing  Post-op Assessment: Report given to PACU RN and Post -op Vital signs reviewed and stable  Post vital signs: Reviewed and stable  Complications: No apparent anesthesia complications

## 2014-05-15 NOTE — Anesthesia Postprocedure Evaluation (Signed)
  Anesthesia Post-op Note  Patient: Erica Hurst  Procedure(s) Performed: Procedure(s): CESAREAN SECTION (N/A)  Patient is awake, responsive, moving her legs, and has signs of resolution of her numbness. Pain and nausea are reasonably well controlled. Vital signs are stable and clinically acceptable. Oxygen saturation is clinically acceptable. There are no apparent anesthetic complications at this time. Patient is ready for discharge.

## 2014-05-15 NOTE — Lactation Note (Signed)
This note was copied from the chart of Erica Marykatherine Orona. Lactation Consultation Note    Initial consult with this mom and term baby, now 42 hours old. The baby has been tachypneac and sleppy, so not feeding well. I assisted mom with latching baby in cross cradle hold. He needed to be latched, but once latched, he had strong suckles on and off for 15 minutes. i did teaching with mom from the Baby and Me booklet, on breast feeding, and left the Lactation pamphlet in the room. Dad is interpreter, [place] speaking, mom understands and speaks some english. Mom knows to call for questions/concerns.   Patient Name: Erica Hurst [id number] Date: 05/15/2014 Reason for consult: Initial assessment   Maternal Data Formula Feeding for Exclusion: No Has patient been taught Hand Expression?: Yes Does the patient have breastfeeding experience prior to this delivery?: No  Feeding Feeding Type: Breast Fed Length of feed: 15 min (on and off sucking - sleepy)  LATCH Score/Interventions Latch: Repeated attempts needed to sustain latch, nipple held in mouth throughout feeding, stimulation needed to elicit sucking reflex. Intervention(s): Adjust position;Assist with latch;Breast compression  Audible Swallowing: A few with stimulation  Type of Nipple: Everted at rest and after stimulation  Comfort (Breast/Nipple): Soft / non-tender     Hold (Positioning): Assistance needed to correctly position infant at breast and maintain latch. Intervention(s): Breastfeeding basics reviewed;Support Pillows;Position options;Skin to skin  LATCH Score: 7  Lactation Tools Discussed/Used     Consult Status Consult Status: Follow-up Date: 05/16/14 Follow-up type: In-patient    Erica Hurst 05/15/2014, 3:19 PM

## 2014-05-15 NOTE — Addendum Note (Signed)
Addendum  created 05/15/14 1435 by Asher Muir, CRNA   Modules edited: Charges VN, Notes Section   Notes Section:  File: [id number]

## 2014-05-15 NOTE — Op Note (Signed)
Cesarean Section Procedure Note  Indications: Induction at 20 4/7wks with failure to progress and fetal intolerance of labor.  I was called to assess tracing which was cat 1 with pitocin off and cat 2 with it on 83mu.  MVU were inadequate.  Pitocin started again at 53mu with return of cat 2 tracing.  C/S was discussed with the patient and her husband with husband translating and she was agreeable to proceed with c/s.  R/B/A were explained to the patient and her husband, questions answered, they verbalized understanding and consent signed and witnessed.  Pre-operative Diagnosis: 1. FTP 2.fetal intolerance   Post-operative Diagnosis: 1.FTP 2.fetal intolerance  Procedure: CESAREAN SECTION  Surgeon: Delice Lesch, MD    Assistants: [name], CNM  Anesthesia: Epidural  Anesthesiologist: Lyndle Herrlich, MD   Procedure Details  The patient was taken to the operating room secondary to fetal intolerance of labor after the risks, benefits, complications, treatment options, and expected outcomes were discussed with the patient.  The patient concurred with the proposed plan, giving informed consent which was signed and witnessed. The patient was taken to Operating Room C-Section Suite, identified as Erica Hurst and the procedure verified as C-Section Delivery. A Time Out was held and the above information confirmed.  After induction of anesthesia by obtaining a spinal, the patient was prepped and draped in the usual sterile manner. A Pfannenstiel skin incision was made and carried down through the subcutaneous tissue to the underlying layer of fascia.  The fascia was incised bilaterally and extended transversely bilaterally with the Mayo scissors. Kocher clamps were placed on the inferior aspect of the fascial incision and the underlying rectus muscle was separated from the fascia. The same was done on the superior aspect of the fascial incision.  The peritoneum was identified, entered bluntly  and extended manually.  An Alexis self-retaining retractor was placed.  The utero-vesical peritoneal reflection was incised transversely and the bladder flap was bluntly freed from the lower uterine segment. A low transverse uterine incision was made with the scalpel and extended bilaterally with the bandage scissors.  The infant was delivered in vertex position without difficulty.  After the umbilical cord was clamped and cut, the infant was handed to the awaiting pediatricians.  Cord blood was obtained for evaluation.  The placenta was removed intact and appeared to be within normal limits. The uterus was cleared of all clots and debris. The uterine incision was closed with running interlocking sutures of 0 Vicryl and a second imbricating layer was performed as well.   Bilateral tubes and ovaries appeared to be within normal limits.  Good hemostasis was noted.  Copious irrigation was performed until clear.  The peritoneum was repaired with 2-0 chromic via a running suture.  The fascia was reapproximated with a running suture of 0 Vicryl. The subcutaneous tissue was reapproximated with 3 interrupted sutures of 2-0 plain.  The skin was reapproximated with a subcuticular suture of 3-0 monocryl.  Steristrips were applied.  Instrument, sponge, and needle counts were correct prior to abdominal closure and at the conclusion of the case.  The patient was awaiting transfer to the recovery room in good condition.  Findings: Live female infant with Apgars 8 at one minute and 9 at five minutes.  Normal appearing bilateral ovaries and fallopian tubes were noted.  Estimated Blood Loss:  1000 ml         Drains: foley to gravity 75 cc         Total IV  Fluids: 3700 ml         Specimens to Pathology: Placenta         Complications:  None; patient tolerated the procedure well.         Disposition: PACU - hemodynamically stable.         Condition: stable  Attending Attestation: I performed the  procedure.

## 2014-05-16 MED ORDER — FERROUS SULFATE 325 (65 FE) MG PO TABS
325.0000 mg | ORAL_TABLET | Freq: Every day | ORAL | Status: DC
  Administered 2014-05-16 – 2014-05-18 (×3): 325 mg via ORAL
  Filled 2014-05-16 (×3): qty 1

## 2014-05-16 NOTE — Progress Notes (Signed)
Subjective: Postpartum Day 1: Cesarean Delivery due to [facility], FTP Patient up ad lib, reports no syncope or dizziness. Feeding:  Breast Contraceptive plan:  Oupatient  Husband present with patient, acting as Optometrist.  Objective: Vital signs in last 24 hours: Temp:  [98 F (36.7 C)-98.9 F (37.2 C)] 98.2 F (36.8 C) (12/31 0550) Pulse Rate:  [73-81] 80 (12/31 0550) Resp:  [18] 18 (12/31 0550) BP: (92-108)/(53-82) 108/53 mmHg (12/31 0550) SpO2:  [96 %-100 %] 98 % (12/31 0100)  Physical Exam:  General: alert Lochia: appropriate Uterine Fundus: firm Abdomen:  + bowel sounds, NT Incision: Honeycomb dressing CDI DVT Evaluation: No evidence of DVT seen on physical exam. Negative Homan's sign.   Recent Labs  05/14/14 0830 05/15/14 0645  HGB 13.1 10.8*  HCT 37.6 31.3*  WBC 6.1 18.4*    Assessment/Plan: Status post Cesarean section day 1. Doing well postoperatively.  Continue current care. May be eligible for d/c tomorrow. Undecided regarding contraception.    Donnel Saxon CNM, [place] 05/16/2014, 9:40 AM

## 2014-05-17 NOTE — Progress Notes (Signed)
Subjective: Postpartum Day 2: Cesarean Delivery due to [facility], FTP Patient up ad lib, reports no syncope or dizziness. Feeding:  Breast Contraceptive plan:  Declines at present  Objective: Vital signs in last 24 hours: Temp:  [98 F (36.7 C)] 98 F (36.7 C) (01/01 0612) Pulse Rate:  [77-104] 77 (01/01 0612) Resp:  [16-18] 16 (01/01 0612) BP: (107-113)/(73-79) 107/73 mmHg (01/01 0612)  Physical Exam:  General: alert Lochia: appropriate Uterine Fundus: firm Abdomen:  + bowel sounds, NT Incision: Honeycomb dressing CDI DVT Evaluation: No evidence of DVT seen on physical exam. Negative Homan's sign.    Recent Labs  05/14/14 0830 05/15/14 0645  HGB 13.1 10.8*  HCT 37.6 31.3*  WBC 6.1 18.4*    Assessment/Plan: Status post Cesarean section day 2. Doing well postoperatively.  Continue current care. Plan for discharge tomorrow    Donnel Saxon CNM, [place] 05/17/2014, 7:49 AM

## 2014-05-17 NOTE — Lactation Note (Signed)
This note was copied from the chart of Erica Elvia Scatena. Lactation Consultation Note  Patient Name: Erica Hurst [id number] Date: 05/17/2014 Reason for consult: Follow-up assessment   Follow-up visit with 39 hr old infant.  Infant has breastfed x9 (10-30 min) + attempt x1 + formula x4(12-21 ml); voids-3 in 24 hrs/ 6 life; stools-3 in 24 hrs/ 4 life with LS-9 by RN.  When [place] arrived in room infant was showing feeding cues so LC encouraged mom to latch infant.  Mom latched to right breast using cradle hold and shallow latch.  LC noted thin, short sublingual anterior frenulum extending close to tip of tongue.  Gloved finger assessment noted infant cannot lateralize tip of tongue side-to-side, cannot stick tongue out of mouth, and when sucking on gloved finger infant is losing latch- "slapping" gloved finger with tongue d/t lack of peristalsis movement because of limited tongue movement.  Short upper lip frenulum noted; slight blanching with flanging of upper lip.  Taught mom how to latch using cross-cradle hold, sandwiching of breast, and asymmetrical latching technique.  Mom c/o pain with breastfeeding regardless of depth and flanging of lips.  Discussed assessment findings with parents and FOB familiar with frenulum issues (stated infants in his home country would go to hospital to have them clipped).  Parents interesting in clipping of frenulum.  LC discussed findings with [facility] and [facility] said they would assess tomorrow.  Comfort gels given with directions for use.    Infant fed for 20 minutes at breast and was not satisfied.  Reviewed hand expression with observation of colostrum.  Taught mom how to use hand pump and mom was able to pump 15 ml colostrum.  Taught dad how to spoon feed and how to feed colostrum back to infant at breast with curved-tip syringe.  Supplemented a total of 9 ml EBM by both spoon and curved-tip at breast.  Infant fed for a total of 30 minutes at breast and was  satisfied after supplementation with extra EBM.  Infant went to sleep and would not take remainder of EBM mom had pumped.  Encouraged parents to keep remainder for next feeding and feed back to infant at breast.  Plan of Care 1.  Pre-Pump with hand pump or DEBP (Mom stated she likes HP better) 2.  Latch infant  3.  Give back EBM by either spoon after breastfeeding or by curved-tip syringe at breast with FOB assisting.  Teaching Service Peds to follow-up with parents tomorrow regarding anterior frenulum and possible clipping.  Encouraged to call for assistance as needed.  LC to follow-up with feeding progress tomorrow.   Maternal Data Has patient been taught Hand Expression?: Yes  Feeding Feeding Type: Breast Milk Length of feed: 30 min  LATCH Score/Interventions Latch: Grasps breast easily, tongue down, lips flanged, rhythmical sucking. Intervention(s): Breast compression;Assist with latch;Adjust position  Audible Swallowing: A few with stimulation Intervention(s): Hand expression  Type of Nipple: Everted at rest and after stimulation  Comfort (Breast/Nipple): Filling, red/small blisters or bruises, mild/mod discomfort  Problem noted: Mild/Moderate discomfort Interventions (Mild/moderate discomfort): Comfort gels;Hand expression  Hold (Positioning): Assistance needed to correctly position infant at breast and maintain latch. Intervention(s): Position options;Support Pillows;Breastfeeding basics reviewed  LATCH Score: 7  Lactation Tools Discussed/Used Tools: Comfort gels;Other (comment) (spoons fed EBM and curved-tip syringe at breast EBM) Pump Review: Setup, frequency, and cleaning;Milk Storage Initiated by:: RN & LC Date initiated:: 05/17/14   Consult Status Consult Status: Follow-up Date: 05/18/14 Follow-up type: In-patient  Merlene Laughter 05/17/2014, 5:55 PM

## 2014-05-18 DIAGNOSIS — B951 Streptococcus, group B, as the cause of diseases classified elsewhere: Secondary | ICD-10-CM | POA: Diagnosis present

## 2014-05-18 DIAGNOSIS — Z98891 History of uterine scar from previous surgery: Secondary | ICD-10-CM

## 2014-05-18 MED ORDER — SENNOSIDES-DOCUSATE SODIUM 8.6-50 MG PO TABS: 2.0000 | ORAL_TABLET | Freq: Every day | ORAL | Status: DC

## 2014-05-18 MED ORDER — FERROUS SULFATE 325 (65 FE) MG PO TABS: 325.0000 mg | ORAL_TABLET | Freq: Every day | ORAL | Status: DC

## 2014-05-18 MED ORDER — OXYCODONE-ACETAMINOPHEN 5-325 MG PO TABS: 1.0000 | ORAL_TABLET | Freq: Four times a day (QID) | ORAL | Status: DC | PRN

## 2014-05-18 MED ORDER — IBUPROFEN 600 MG PO TABS: 600.0000 mg | ORAL_TABLET | Freq: Four times a day (QID) | ORAL | Status: DC

## 2014-05-18 NOTE — Discharge Summary (Signed)
. Cesarean Section Delivery Discharge Summary  Erica Hurst  DOB:    16-May-1981 MRN:    [id number] CSN:    [id number]  Date of admission:                  May 14, 2014  Date of discharge:                   May 18, 2014  Procedures this admission: IOL for post date pregnancy, Primary C/S for Fetal Intolerance to Labor  Date of Delivery: May 15, 2014  Newborn Data:  Live born female  Birth Weight: 7 lb 5.8 oz (3340 g) APGAR: 8, 9  Home with mother after additional day of observation Name: Erica Hurst Circumcision Plan: Outpatient  History of Present Illness:  Ms. Erica Hurst is a 34 y.o. female, G3P1020, who presents at [redacted]w[redacted]d weeks gestation. The patient has been followed at the [facility] and [facility]necology division of [facility].    Her pregnancy has been complicated by:  Patient Active Problem List   Diagnosis Date Noted  . S/P cesarean section 05/18/2014  . Group beta Strep positive 05/18/2014  . Post-dates pregnancy 05/14/2014  . 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 Positive GBS.  Utilized epidural for pain management.   Patient was given one dose of cytotec and then started on pitocin augmentation.  Due to fetal intolerance, she was consented for cesarean, with Dr. Octavio Manns performing a primary LTCS under epidural anesthesia, with delivery of a viable female, with weight and Apgars as listed above. Infant was in good condition and remained at the patient's bedside.  The patient was taken to recovery in good condition.  Patient planned to breast feed.  On post-op day 1, patient was doing well, tolerating a regular diet, with Hgb of 10.8.  Throughout her stay, her physical exam was WNL, her incision was CDI, and her vital signs remained stable.  By post-op day 3, she was up ad lib, tolerating a regular diet, with good pain control  with po med.  She was deemed to have received the full benefit of her hospital stay, and was discharged home in stable condition.  However, infant was kept for additional observation related to transient tachypnea.  Contraceptive choice was undecided.    Feeding:  breast  Contraception:  Undecided  Discharge hemoglobin:  HEMOGLOBIN  Date Value Ref Range Status  05/15/2014 10.8* 12.0 - 15.0 g/dL Final    Comment:    REPEATED TO VERIFY DELTA CHECK NOTED   05/14/2014 13.1 12.0 - 15.0 g/dL Final  06/30/2012 14.7 12.0 - 15.0 g/dL Final   HCT  Date Value Ref Range Status  05/15/2014 31.3* 36.0 - 46.0 % Final  05/14/2014 37.6 36.0 - 46.0 % Final  06/30/2012 42.4 36.0 - 46.0 % Final   WBC  Date Value Ref Range Status  05/15/2014 18.4* 4.0 - 10.5 K/uL Final  05/14/2014 6.1 4.0 - 10.5 K/uL Final  06/30/2012 6.3 4.0 - 10.5 K/uL Final    Discharge Physical Exam:   General: alert, cooperative and no distress  Chest: HRRR, Lungs CTA Lochia: appropriate Uterine Fundus: firm, U/-1 Abdomen:  + bowel sounds, soft, appropriately tender Incision: Honeycomb dressing CDI DVT Evaluation: No evidence of DVT seen on physical exam. No significant calf/ankle edema.  Intrapartum Procedures: cesarean: low cervical, transverse Postpartum Procedures: none Complications-Operative and Postpartum: none  Discharge  Diagnoses: Post-date pregnancy and Primary C/S  Discharge Information:  Activity:           pelvic rest Diet:                routine Medications: PNV, Ibuprofen, Colace and Percocet Condition:      stable Instructions: Incision care, Postpartum Depression, Activity Restrictions, Breastfeeding, Who and when to call for PP Concerns, Follow Up Appointment  Discharge to: home  Follow-up Information    Follow up with [facility]. Schedule an appointment as soon as possible for a visit in 5 weeks.   Specialty:  Obstetrics and Gynecology   Why:  Please  call if you have any questions or concerns, prior to your next visit.   Contact information:   [address] [address] [phone]       Sedalia Muta, MSN, CNM 05/18/2014 10:11 AM

## 2014-05-18 NOTE — Lactation Note (Signed)
This note was copied from the chart of Erica Delainee Roane. Lactation Consultation Note  FOB translating. Ped MD requested second view of frenulum prior to possible clipping. Baby has distinct labial frenulum to gum line and anterior lingual frenulum w/some limited mobility. Baby has suck blisters on upper lip. Mother's left nipple is tender.  Attempted bf but baby was sleepy.   Baby has only 4.5% weight loss. Baby had brownish yellow stool.  Stools transitioning. Mom encouraged to feed baby 8-12 times/24 hours and with feeding cues.  Mother has comfort gels and reviewed applying ebm. MD will re-evaluate tomorrow.  Patient Name: Erica Hurst Date: 05/18/2014 Reason for consult: Follow-up assessment   Maternal Data    Feeding Feeding Type: Breast Fed Length of feed: 15 min  LATCH Score/Interventions                      Lactation Tools Discussed/Used     Consult Status Consult Status: Follow-up Date: 05/19/14 Follow-up type: In-patient    Vivianne Master [facility] 05/18/2014, 3:39 PM

## 2014-05-18 NOTE — Discharge Instructions (Signed)
Postpartum Care After Cesarean Delivery °After you deliver your newborn (postpartum period), the usual stay in the hospital is 24-72 hours. If there were problems with your labor or delivery, or if you have other medical problems, you might be in the hospital longer.  °While you are in the hospital, you will receive help and instructions on how to care for yourself and your newborn during the postpartum period.  °While you are in the hospital: °· It is normal for you to have pain or discomfort from the incision in your abdomen. Be sure to tell your nurses when you are having pain, where the pain is located, and what makes the pain worse. °· If you are breastfeeding, you may feel uncomfortable contractions of your uterus for a couple of weeks. This is normal. The contractions help your uterus get back to normal size. °· It is normal to have some bleeding after delivery. °¨ For the first 1-3 days after delivery, the flow is red and the amount may be similar to a period. °¨ It is common for the flow to start and stop. °¨ In the first few days, you may pass some small clots. Let your nurses know if you begin to pass large clots or your flow increases. °¨ Do not  flush blood clots down the toilet before having the nurse look at them. °¨ During the next 3-10 days after delivery, your flow should become more watery and pink or brown-tinged in color. °¨ Ten to fourteen days after delivery, your flow should be a small amount of yellowish-white discharge. °¨ The amount of your flow will decrease over the first few weeks after delivery. Your flow may stop in 6-8 weeks. Most women have had their flow stop by 12 weeks after delivery. °· You should change your sanitary pads frequently. °· Wash your hands thoroughly with soap and water for at least 20 seconds after changing pads, using the toilet, or before holding or feeding your newborn. °· Your intravenous (IV) tubing will be removed when you are drinking enough fluids. °· The  urine drainage tube (urinary catheter) that was inserted before delivery may be removed within 6-8 hours after delivery or when feeling returns to your legs. You should feel like you need to empty your bladder within the first 6-8 hours after the catheter has been removed. °· In case you become weak, lightheaded, or faint, call your nurse before you get out of bed for the first time and before you take a shower for the first time. °· Within the first few days after delivery, your breasts may begin to feel tender and full. This is called engorgement. Breast tenderness usually goes away within 48-72 hours after engorgement occurs. You may also notice milk leaking from your breasts. If you are not breastfeeding, do not stimulate your breasts. Breast stimulation can make your breasts produce more milk. °· Spending as much time as possible with your newborn is very important. During this time, you and your newborn can feel close and get to know each other. Having your newborn stay in your room (rooming in) will help to strengthen the bond with your newborn. It will give you time to get to know your newborn and become comfortable caring for your newborn. °· Your hormones change after delivery. Sometimes the hormone changes can temporarily cause you to feel sad or tearful. These feelings should not last more than a few days. If these feelings last longer than that, you should talk to your   caregiver.  If desired, talk to your caregiver about methods of family planning or contraception.  Talk to your caregiver about immunizations. Your caregiver may want you to have the following immunizations before leaving the hospital:  Tetanus, diphtheria, and pertussis (Tdap) or tetanus and diphtheria (Td) immunization. It is very important that you and your family (including grandparents) or others caring for your newborn are up-to-date with the Tdap or Td immunizations. The Tdap or Td immunization can help protect your newborn  from getting ill.  Rubella immunization.  Varicella (chickenpox) immunization.  Influenza immunization. You should receive this annual immunization if you did not receive the immunization during your pregnancy. Document Released: 01/26/2012 Document Reviewed: 01/26/2012 [facility] Patient Information 2015 [place]. This information is not intended to replace advice given to you by your health care provider. Make sure you discuss any questions you have with your health care provider. Postpartum Depression and Baby Blues The postpartum period begins right after the birth of a baby. During this time, there is often a great amount of joy and excitement. It is also a time of many changes in the life of the parents. Regardless of how many times a mother gives birth, each child brings new challenges and dynamics to the family. It is not unusual to have feelings of excitement along with confusing shifts in moods, emotions, and thoughts. All mothers are at risk of developing postpartum depression or the "baby blues." These mood changes can occur right after giving birth, or they may occur many months after giving birth. The baby blues or postpartum depression can be mild or severe. Additionally, postpartum depression can go away rather quickly, or it can be a long-term condition.  CAUSES Raised hormone levels and the rapid drop in those levels are thought to be a main cause of postpartum depression and the baby blues. A number of hormones change during and after pregnancy. Estrogen and progesterone usually decrease right after the delivery of your baby. The levels of thyroid hormone and various cortisol steroids also rapidly drop. Other factors that play a role in these mood changes include major life events and genetics.  RISK FACTORS If you have any of the following risks for the baby blues or postpartum depression, know what symptoms to watch out for during the postpartum period. Risk factors that may  increase the likelihood of getting the baby blues or postpartum depression include:  Having a personal or family history of depression.   Having depression while being pregnant.   Having premenstrual mood issues or mood issues related to oral contraceptives.  Having a lot of life stress.   Having marital conflict.   Lacking a social support network.   Having a baby with special needs.   Having health problems, such as diabetes.  SIGNS AND SYMPTOMS Symptoms of baby blues include:  Brief changes in mood, such as going from extreme happiness to sadness.  Decreased concentration.   Difficulty sleeping.   Crying spells, tearfulness.   Irritability.   Anxiety.  Symptoms of postpartum depression typically begin within the first month after giving birth. These symptoms include:  Difficulty sleeping or excessive sleepiness.   Marked weight loss.   Agitation.   Feelings of worthlessness.   Lack of interest in activity or food.  Postpartum psychosis is a very serious condition and can be dangerous. Fortunately, it is rare. Displaying any of the following symptoms is cause for immediate medical attention. Symptoms of postpartum psychosis include:   Hallucinations and delusions.  Bizarre or disorganized behavior.   Confusion or disorientation.  DIAGNOSIS  A diagnosis is made by an evaluation of your symptoms. There are no medical or lab tests that lead to a diagnosis, but there are various questionnaires that a health care provider may use to identify those with the baby blues, postpartum depression, or psychosis. Often, a screening tool called the [place] Postnatal Depression Scale is used to diagnose depression in the postpartum period.  TREATMENT The baby blues usually goes away on its own in 1-2 weeks. Social support is often all that is needed. You will be encouraged to get adequate sleep and rest. Occasionally, you may be given medicines to help you  sleep.  Postpartum depression requires treatment because it can last several months or longer if it is not treated. Treatment may include individual or group therapy, medicine, or both to address any social, physiological, and psychological factors that may play a role in the depression. Regular exercise, a healthy diet, rest, and social support may also be strongly recommended.  Postpartum psychosis is more serious and needs treatment right away. Hospitalization is often needed. HOME CARE INSTRUCTIONS  Get as much rest as you can. Nap when the baby sleeps.   Exercise regularly. Some women find yoga and walking to be beneficial.   Eat a balanced and nourishing diet.   Do little things that you enjoy. Have a cup of tea, take a bubble bath, read your favorite magazine, or listen to your favorite music.  Avoid alcohol.   Ask for help with household chores, cooking, grocery shopping, or running errands as needed. Do not try to do everything.   Talk to people close to you about how you are feeling. Get support from your partner, family members, friends, or other new moms.  Try to stay positive in how you think. Think about the things you are grateful for.   Do not spend a lot of time alone.   Only take over-the-counter or prescription medicine as directed by your health care provider.  Keep all your postpartum appointments.   Let your health care provider know if you have any concerns.  SEEK MEDICAL CARE IF: You are having a reaction to or problems with your medicine. SEEK IMMEDIATE MEDICAL CARE IF:  You have suicidal feelings.   You think you may harm the baby or someone else. MAKE SURE YOU:  Understand these instructions.  Will watch your condition.  Will get help right away if you are not doing well or get worse. Document Released: 02/05/2004 Document Revised: 05/08/2013 Document Reviewed: 02/12/2013 [facility] Patient Information 2015 [place], [place]. This  information is not intended to replace advice given to you by your health care provider. Make sure you discuss any questions you have with your health care provider.

## 2014-05-19 ENCOUNTER — Ambulatory Visit: Payer: Self-pay

## 2014-05-19 NOTE — Lactation Note (Signed)
This note was copied from the chart of Erica Seryna Conkright. Lactation Consultation Note  Baby gained weight, stools transitioned and mother's breasts are filling. Baby did well breastfeeding through the night. Parents want to wait on frenotomy.  Provided [facility] phone numbers and outpt information if needed. Reviewed engorgement care and monitoring voids/stools.  Patient Name: Erica Hurst [email or web address] Date: 05/19/2014 Reason for consult: Follow-up assessment   Maternal Data    Feeding    LATCH Score/Interventions                      Lactation Tools Discussed/Used     Consult Status Consult Status: Complete    Carlye Grippe 05/19/2014, 9:01 AM

## 2014-05-21 ENCOUNTER — Encounter (HOSPITAL_COMMUNITY): Payer: Self-pay | Admitting: Obstetrics and Gynecology

## 2014-06-07 ENCOUNTER — Encounter (HOSPITAL_COMMUNITY): Payer: Self-pay | Admitting: Obstetrics and Gynecology

## 2014-06-07 ENCOUNTER — Inpatient Hospital Stay (HOSPITAL_COMMUNITY)
Admission: AD | Admit: 2014-06-07 | Discharge: 2014-06-07 | Disposition: A | Discharge status: Home / Self Care | Payer: Medicaid Other | Source: Ambulatory Visit | Attending: Obstetrics and Gynecology | Admitting: Obstetrics and Gynecology

## 2014-06-07 DIAGNOSIS — O864 Pyrexia of unknown origin following delivery: Secondary | ICD-10-CM | POA: Diagnosis present

## 2014-06-07 DIAGNOSIS — O9122 Nonpurulent mastitis associated with the puerperium: Secondary | ICD-10-CM | POA: Insufficient documentation

## 2014-06-07 LAB — URINALYSIS, ROUTINE W REFLEX MICROSCOPIC
Bilirubin Urine: NEGATIVE
Glucose, UA: NEGATIVE mg/dL
Ketones, ur: NEGATIVE mg/dL
NITRITE: NEGATIVE
Protein, ur: NEGATIVE mg/dL
Specific Gravity, Urine: 1.025 (ref 1.005–1.030)
Urobilinogen, UA: 0.2 mg/dL (ref 0.0–1.0)
pH: 6 (ref 5.0–8.0)

## 2014-06-07 LAB — CBC WITH DIFFERENTIAL/PLATELET
Basophils Absolute: 0 10*3/uL (ref 0.0–0.1)
Basophils Relative: 0 % (ref 0–1)
Eosinophils Absolute: 0 10*3/uL (ref 0.0–0.7)
Eosinophils Relative: 0 % (ref 0–5)
HEMATOCRIT: 40.8 % (ref 36.0–46.0)
Hemoglobin: 13.8 g/dL (ref 12.0–15.0)
Lymphocytes Relative: 12 % (ref 12–46)
Lymphs Abs: 1.3 10*3/uL (ref 0.7–4.0)
MCH: 29 pg (ref 26.0–34.0)
MCHC: 33.8 g/dL (ref 30.0–36.0)
MCV: 85.7 fL (ref 78.0–100.0)
MONO ABS: 0.6 10*3/uL (ref 0.1–1.0)
MONOS PCT: 6 % (ref 3–12)
NEUTROS ABS: 9.2 10*3/uL — AB (ref 1.7–7.7)
NEUTROS PCT: 82 % — AB (ref 43–77)
PLATELETS: 227 10*3/uL (ref 150–400)
RBC: 4.76 MIL/uL (ref 3.87–5.11)
RDW: 13.4 % (ref 11.5–15.5)
WBC: 11.2 10*3/uL — ABNORMAL HIGH (ref 4.0–10.5)

## 2014-06-07 LAB — URINE MICROSCOPIC-ADD ON

## 2014-06-07 MED ORDER — PNV PRENATAL PLUS MULTIVITAMIN 27-1 MG PO TABS: 1.0000 | ORAL_TABLET | Freq: Every day | ORAL | Status: DC

## 2014-06-07 MED ORDER — CEPHALEXIN 500 MG PO CAPS: 500.0000 mg | ORAL_CAPSULE | Freq: Four times a day (QID) | ORAL | Status: DC

## 2014-06-07 MED ORDER — PRENATAL PLUS 27-1 MG PO TABS: 1.0000 | ORAL_TABLET | Freq: Every day | ORAL | Status: DC

## 2014-06-07 MED ORDER — ACETAMINOPHEN 325 MG PO TABS
650.0000 mg | ORAL_TABLET | Freq: Once | ORAL | Status: AC
  Administered 2014-06-07: 650 mg via ORAL
  Filled 2014-06-07: qty 2

## 2014-06-07 MED ORDER — CEPHALEXIN 500 MG PO CAPS
500.0000 mg | ORAL_CAPSULE | Freq: Once | ORAL | Status: AC
  Administered 2014-06-07: 500 mg via ORAL
  Filled 2014-06-07: qty 1

## 2014-06-07 NOTE — Discharge Instructions (Signed)
Take Tylenol 2 tablets every 4 hours for the next 24 hours. Take Cephalexin antibiotic every 4 hours for the next 7 days. Call [facility] at [phone] (option #4) with any questions or concerns.   Breastfeeding and Mastitis Mastitis is inflammation of the breast tissue. It can occur in women who are breastfeeding. This can make breastfeeding painful. Mastitis will sometimes go away on its own. Your health care provider will help determine if treatment is needed. CAUSES Mastitis is often associated with a blocked milk (lactiferous) duct. This can happen when too much milk builds up in the breast. Causes of excess milk in the breast can include:  Poor latch-on. If your baby is not latched onto the breast properly, she or he may not empty your breast completely while breastfeeding.  Allowing too much time to pass between feedings.  Wearing a bra or other clothing that is too tight. This puts extra pressure on the lactiferous ducts so milk does not flow through them as it should. Mastitis can also be caused by a bacterial infection. Bacteria may enter the breast tissue through cuts or openings in the skin. In women who are breastfeeding, this may occur because of cracked or irritated skin. Cracks in the skin are often caused when your baby does not latch on properly to the breast. SIGNS AND SYMPTOMS  Swelling, redness, tenderness, and pain in an area of the breast.  Swelling of the glands under the arm on the same side.  Fever may or may not accompany mastitis. If an infection is allowed to progress, a collection of pus (abscess) may develop. DIAGNOSIS  Your health care provider can usually diagnose mastitis based on your symptoms and a physical exam. Tests may be done to help confirm the diagnosis. These may include:  Removal of pus from the breast by applying pressure to the area. This pus can be examined in the lab to determine which bacteria are present. If an abscess has developed,  the fluid in the abscess can be removed with a needle. This can also be used to confirm the diagnosis and determine the bacteria present. In most cases, pus will not be present.  Blood tests to determine if your body is fighting a bacterial infection.  Mammogram or ultrasound tests to rule out other problems or diseases. TREATMENT  Mastitis that occurs with breastfeeding will sometimes go away on its own. Your health care provider may choose to wait 24 hours after first seeing you to decide whether a prescription medicine is needed. If your symptoms are worse after 24 hours, your health care provider will likely prescribe an antibiotic medicine to treat the mastitis. He or she will determine which bacteria are most likely causing the infection and will then select an appropriate antibiotic medicine. This is sometimes changed based on the results of tests performed to identify the bacteria, or if there is no response to the antibiotic medicine selected. Antibiotic medicines are usually given by mouth. You may also be given medicine for pain. HOME CARE INSTRUCTIONS  Only take over-the-counter or prescription medicines for pain, fever, or discomfort as directed by your health care provider.  If your health care provider prescribed an antibiotic medicine, take the medicine as directed. Make sure you finish it even if you start to feel better.  Do not wear a tight or underwire bra. Wear a soft, supportive bra.  Increase your fluid intake, especially if you have a fever.  Continue to empty the breast. Your health care  provider can tell you whether this milk is safe for your infant or needs to be thrown out. You may be told to stop nursing until your health care provider thinks it is safe for your baby. Use a breast pump if you are advised to stop nursing.  Keep your nipples clean and dry.  Empty the first breast completely before going to the other breast. If your baby is not emptying your breasts  completely for some reason, use a breast pump to empty your breasts.  If you go back to work, pump your breasts while at work to stay in time with your nursing schedule.  Avoid allowing your breasts to become overly filled with milk (engorged). SEEK MEDICAL CARE IF:  You have pus-like discharge from the breast.  Your symptoms do not improve with the treatment prescribed by your health care provider within 2 days. SEEK IMMEDIATE MEDICAL CARE IF:  Your pain and swelling are getting worse.  You have pain that is not controlled with medicine.  You have a red line extending from the breast toward your armpit.  You have a fever or persistent symptoms for more than 2-3 days.  You have a fever and your symptoms suddenly get worse. MAKE SURE YOU:   Understand these instructions.  Will watch your condition.  Will get help right away if you are not doing well or get worse. Document Released: 08/28/2004 Document Revised: 05/08/2013 Document Reviewed: 12/07/2012 [facility] Patient Information 2015 [place], [place]. This information is not intended to replace advice given to you by your health care provider. Make sure you discuss any questions you have with your health care provider.  Urinary Tract Infection A urinary tract infection (UTI) can occur any place along the urinary tract. The tract includes the kidneys, ureters, bladder, and urethra. A type of germ called bacteria often causes a UTI. UTIs are often helped with antibiotic medicine.  HOME CARE   If given, take antibiotics as told by your doctor. Finish them even if you start to feel better.  Drink enough fluids to keep your pee (urine) clear or pale yellow.  Avoid tea, drinks with caffeine, and bubbly (carbonated) drinks.  Pee often. Avoid holding your pee in for a long time.  Pee before and after having sex (intercourse).  Wipe from front to back after you poop (bowel movement) if you are a woman. Use each tissue only  once. GET HELP RIGHT AWAY IF:   You have back pain.  You have lower belly (abdominal) pain.  You have chills.  You feel sick to your stomach (nauseous).  You throw up (vomit).  Your burning or discomfort with peeing does not go away.  You have a fever.  Your symptoms are not better in 3 days. MAKE SURE YOU:   Understand these instructions.  Will watch your condition.  Will get help right away if you are not doing well or get worse. Document Released: 10/20/2007 Document Revised: 01/26/2012 Document Reviewed: 12/02/2011 [facility] Patient Information 2015 [place], [place]. This information is not intended to replace advice given to you by your health care provider. Make sure you discuss any questions you have with your health care provider.

## 2014-06-07 NOTE — MAU Provider Note (Signed)
History   33 yo G3P1021 s/p primary LTCS on 12/30 for FTP and fetal intolerance to labor presented after husband called the office to report patient with fever and chills since last night.  Also reports HA, mild dysuria yesterday, mild pain/heaviness in left breast.  Denies N/V, diarrhea, incisional pain, SOB, cough, nasal congestion, leg pain, heavy bleeding, or frequency.  Breastfeeding going well.  Has not taken any medication today.  Took single dose of Tylenol yesterday.  Husband with patient today, serving as Optometrist as needed.  Patient Active Problem List   Diagnosis Date Noted  . Postpartum fever 06/07/2014  . S/P cesarean section 05/18/2014  . Group beta Strep positive 05/18/2014  . Post-dates pregnancy 05/14/2014  . 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  Chief Complaint  Patient presents with  . Fever   HPI:  As above  OB History    Gravida Para Term Preterm AB TAB SAB Ectopic Multiple Living   3 1 1  0 2 0 2 0 0 1      Past Medical History  Diagnosis Date  . Hepatitis B affecting pregnancy   . Fibroid     Past Surgical History  Procedure Laterality Date  . Dilitation & currettage/hystroscopy with versapoint resection N/A 06/30/2012    Procedure: DILATATION & CURETTAGE/HYSTEROSCOPY WITH VERSAPOINT RESECTION;  Surgeon: Princess Bruins, MD;  Location: [facility];  Service: Gynecology;  Laterality: N/A;  . Cesarean section N/A 05/15/2014    Procedure: CESAREAN SECTION;  Surgeon: Delice Lesch, MD;  Location: [facility];  Service: Obstetrics;  Laterality: N/A;    Family History  Problem Relation Age of Onset  . Liver disease Neg Hx     History  Substance Use Topics  . Smoking status: Never Smoker   . Smokeless tobacco: Not on file  . Alcohol Use: No    Allergies: No Known Allergies  Prescriptions prior to admission  Medication Sig Dispense Refill Last Dose  . ferrous sulfate 325 (65 FE) MG tablet  Take 1 tablet (325 mg total) by mouth daily with breakfast. 30 tablet 3   . ibuprofen (ADVIL,MOTRIN) 600 MG tablet Take 1 tablet (600 mg total) by mouth every 6 (six) hours. 30 tablet 0   . oxyCODONE-acetaminophen (PERCOCET/ROXICET) 5-325 MG per tablet Take 1-2 tablets by mouth every 6 (six) hours as needed (for pain scale less than 7). 30 tablet 0   . Prenatal Vit-Fe Fumarate-FA (PRENATAL MULTIVITAMIN) TABS tablet Take 1 tablet by mouth daily at 12 noon.   05/13/2014 at Unknown time  . senna-docusate (SENOKOT-S) 8.6-50 MG per tablet Take 2 tablets by mouth at bedtime. 14 tablet 0     ROS:  Fever, chills, mild pain in left breast Physical Exam   Blood pressure 124/75, pulse 93, temperature 101.4 F (38.6 C), temperature source Oral, resp. rate 16, unknown if currently breastfeeding.    Physical Exam  Chills noted Chest clear Heart RRR without murmur Breasts--right breast lactating, no erythema or tenderness.  Left breast lactating, mild tenderness to touch, mild erythema on outer aspect Abd soft, NT, C/S incision CDI, with steristrips still in place.  No erythema, drainage, or cellulitis. Uterus NT, approx 12 week size, firm Lochia small, no foul odor Back--negative CVAT Ext WNL, negative Homan's  ED Course  Assessment: 23 days s/p primary LTCS  PP fever Likely mastitis  Plan: CBC, diff, blood cultures x 2 UA, urine culture Tylenol now Breast milk culture Will  consult with Dr. Cletis Media when labs resulted.   Donnel Saxon CNM, MSN 06/07/2014 2:18 PM  Addendum: Feeling better s/p Tylenol.  Filed Vitals:   06/07/14 1353 06/07/14 1443  BP: 124/75   Pulse: 93   Temp: 101.4 F (38.6 C) 100.5 F (38.1 C)  TempSrc: Oral Oral  Resp: 16    Results for orders placed or performed during the hospital encounter of 06/07/14 (from the past 24 hour(s))  Urinalysis, Routine w reflex microscopic     Status: Abnormal   Collection Time: 06/07/14  1:35 PM  Result Value Ref Range    Color, Urine YELLOW YELLOW   APPearance CLEAR CLEAR   Specific Gravity, Urine 1.025 1.005 - 1.030   pH 6.0 5.0 - 8.0   Glucose, UA NEGATIVE NEGATIVE mg/dL   Hgb urine dipstick LARGE (A) NEGATIVE   Bilirubin Urine NEGATIVE NEGATIVE   Ketones, ur NEGATIVE NEGATIVE mg/dL   Protein, ur NEGATIVE NEGATIVE mg/dL   Urobilinogen, UA 0.2 0.0 - 1.0 mg/dL   Nitrite NEGATIVE NEGATIVE   Leukocytes, UA MODERATE (A) NEGATIVE  Urine microscopic-add on     Status: Abnormal   Collection Time: 06/07/14  1:35 PM  Result Value Ref Range   Squamous Epithelial / LPF FEW (A) RARE   WBC, UA 21-50 <3 WBC/hpf   RBC / HPF 21-50 <3 RBC/hpf   Bacteria, UA FEW (A) RARE  CBC with Differential     Status: Abnormal   Collection Time: 06/07/14  2:23 PM  Result Value Ref Range   WBC 11.2 (H) 4.0 - 10.5 K/uL   RBC 4.76 3.87 - 5.11 MIL/uL   Hemoglobin 13.8 12.0 - 15.0 g/dL   HCT 40.8 36.0 - 46.0 %   MCV 85.7 78.0 - 100.0 fL   MCH 29.0 26.0 - 34.0 pg   MCHC 33.8 30.0 - 36.0 g/dL   RDW 13.4 11.5 - 15.5 %   Platelets 227 150 - 400 K/uL   Neutrophils Relative % 82 (H) 43 - 77 %   Neutro Abs 9.2 (H) 1.7 - 7.7 K/uL   Lymphocytes Relative 12 12 - 46 %   Lymphs Abs 1.3 0.7 - 4.0 K/uL   Monocytes Relative 6 3 - 12 %   Monocytes Absolute 0.6 0.1 - 1.0 K/uL   Eosinophils Relative 0 0 - 5 %   Eosinophils Absolute 0.0 0.0 - 0.7 K/uL   Basophils Relative 0 0 - 1 %   Basophils Absolute 0.0 0.0 - 0.1 K/uL   Urine to culture. Breast milk culture sent.  Impression: PP fever Possible UTI Mastitis, left breast  Plan: Consulted with Dr. Cletis Media D/C home with UTI and mastitis precautions. Rx Keflex 500 mg po QID x 7 days. Tylenol 650 mg q 4 hours x 24 hours for fever control. Rx Prenatal vit refill for patient, sent to pharmacy. F/u with any worsening of sx or any questions. Will f/u on urine culture, breast milk culture, and blood cultures.  Donnel Saxon, CNM 06/07/14 3p

## 2014-06-08 LAB — URINE CULTURE: Colony Count: 85000

## 2014-06-10 LAB — WOUND CULTURE

## 2014-06-13 LAB — CULTURE, BLOOD (ROUTINE X 2)
CULTURE: NO GROWTH
Culture: NO GROWTH
SPECIAL REQUESTS: NORMAL
Special Requests: NORMAL

## 2020-11-19 ENCOUNTER — Ambulatory Visit (INDEPENDENT_AMBULATORY_CARE_PROVIDER_SITE_OTHER): Payer: Self-pay | Admitting: Family Medicine

## 2020-11-19 ENCOUNTER — Other Ambulatory Visit: Payer: Self-pay

## 2020-11-19 VITALS — BP 125/77 | HR 73 | Ht 66.0 in | Wt 183.8 lb

## 2020-11-19 DIAGNOSIS — Z124 Encounter for screening for malignant neoplasm of cervix: Secondary | ICD-10-CM

## 2020-11-19 DIAGNOSIS — B181 Chronic viral hepatitis B without delta-agent: Secondary | ICD-10-CM

## 2020-11-19 NOTE — Progress Notes (Signed)
SUBJECTIVE:   CHIEF COMPLAINT / HPI:  The patient is a 40 year old female coming to clinic for a medical checkup with no significant medical concerns. The patient has lived in [place] for a few years and is now reestablishing care in [place]. She does not have any fevers, chills, weight gain or loss, nausea, vomiting, pain, rashes, or other concerns elicited by a complete ROS.  PERTINENT PMH: The patient is gravida 5 para 3, with her last delivery approximately 2.5 years ago. All children were delivered by C-section. Her last pregnancy was complicated by Hepatitis B infection. The patient does not report any further complications during any of the pregnancies or deliveries. Her last menstrual period was light and 1.5 months ago and she reports they are not normally regular due to her IUD. Her most recent pap smear was done at a [facility] facility in [place] in approximately 2018 with no significant findings.  Her surgical history is significant for the Cesarean deliveries and a uterine fibroid removal in 2020.  [place] from [place] approximately 9 years ago and received a Tdap vaccine before the immigration. She is up to date on her COVID vaccines (first one in May 2021, second in June 2021, and a booster in the winter of 2021).  She does not report any childhood illnesses or allergies. She is not taking any medications.  The patient was diagnosed with hepatitis B infection in 2015 during one of her pregnancies.  FH:  The patient is not aware of any significant medical history in her immediate relatives, many of whom live in [place].  PSH:  The patient does not smoke or drink and never has due to observance of her religious faith. She feels safe and secure in her home and relationships and does not report unusual stress in her daily life. She is sexually active in a monogamous relationship with her husband and does not use condoms, but she has a 3-year uterine  IUD (implanted in December of 2019).  She reports eating a diet of predominately home cooking with lots of meat and fish, though she does not like vegetables and rarely eats them. She eats lots of fruits instead. She walks daily for approximately 15 minutes around her neighborhood.  OBJECTIVE:   BP 125/77   Pulse 73   Ht 5\' 6"  (1.676 m)   Wt 183 lb 12.8 oz (83.4 kg)   LMP 11/04/2020   SpO2 99%   BMI 29.67 kg/m   Cardiovascular: RRR, normal S1 and S2, no murmurs, rubs, or gallops Pulmonary: Clear bilaterally to auscultation, and no wheezes, crackles, or rales GI: Normal bowel sounds, nontender to palpation, no rebound tenderness or guarding, no hepatosplenomegaly  ASSESSMENT/PLAN:   Chronic hepatitis B (HCC) The patient is at risk of complications from hepatitis B and it is important to monitor the disease course. Both a lack of significant findings of hepatomegaly or ascites on physical exam and no symptoms of hepatic disease are reassuring. Ordered a CMP and Hep B DNA PCR as well as a RUQ ultrasound with elastography to assess liver structure. Referred patient to reestablish care with Infectious Disease specialist whom she saw previously.  Preventative The patient is up to date on vaccines and preventative screening measures. Patient advised to return for a pap smear in 2023 and get an IUD replacement in December of this year. Discussed increasing variety of diet with more vegetables. Counseled patient on increasing exercise by walking 30 minutes  or more per day. She was advised to return in 1 year for another regular checkup or earlier if there are other concerns.  Keelie Zemanek Mining engineer, [place]

## 2020-11-19 NOTE — Patient Instructions (Signed)
It was a pleasure to see you today!  Today I will get blood work to check your liver. If everything is normal, I will send you a letter. If there is something we should do, I will call you in the next two weeks.  We will get you scheduled for an ultrasound of your liver and let you know the appointment.  Make an appointment for an IUD removal and replacement in December 2022.  You will need a pap smear and a tetanus shot in 2023.  If you need anything sooner, please don't hesitate to call [phone].  Be Well,  Dr. Verner Mould fut un plaisir de vous voir aujourd'hui !  Aujourd'hui, je vais faire une analyse de sang pour Systems developer. Si tout est normal, je vous enverrai [employer]. S'il y a quelque chose que nous devons faire, je vous appellerai dans les deux prochaines semaines.  Nous vous fixerons un rendez-vous pour [employer] de votre foie et vous informerons du rendez-vous.  Prenez rendez-vous pour un retrait et un remplacement de strilet en dcembre 2022.  Vous aurez besoin d'un frottis vaginal et [employer] vaccin contre le ttanos en 2023.  Si vous avez besoin de quoi que ce soit plus tt, n'hsitez pas  appeler le [phone].  Hilda Blades,  Dr Denyse Dago

## 2020-11-19 NOTE — Assessment & Plan Note (Signed)
The patient is at risk of complications from hepatitis B and it is important to monitor the disease course. Both a lack of significant findings of hepatomegaly or ascites on physical exam and no symptoms of hepatic disease are reassuring. Ordered a CMP and Hep B DNA PCR as well as a RUQ ultrasound with elastography to assess liver structure. Referred patient to reestablish care with Infectious Disease specialist whom she saw previously.

## 2020-11-21 LAB — COMPREHENSIVE METABOLIC PANEL
ALT: 17 IU/L (ref 0–32)
AST: 15 IU/L (ref 0–40)
Albumin/Globulin Ratio: 1.7 (ref 1.2–2.2)
Albumin: 4.5 g/dL (ref 3.8–4.8)
Alkaline Phosphatase: 43 IU/L — ABNORMAL LOW (ref 44–121)
BUN/Creatinine Ratio: 11 (ref 9–23)
BUN: 8 mg/dL (ref 6–20)
Bilirubin Total: 0.3 mg/dL (ref 0.0–1.2)
CO2: 24 mmol/L (ref 20–29)
Calcium: 9.2 mg/dL (ref 8.7–10.2)
Chloride: 102 mmol/L (ref 96–106)
Creatinine, Ser: 0.71 mg/dL (ref 0.57–1.00)
Globulin, Total: 2.6 g/dL (ref 1.5–4.5)
Glucose: 92 mg/dL (ref 65–99)
Potassium: 4.1 mmol/L (ref 3.5–5.2)
Sodium: 137 mmol/L (ref 134–144)
Total Protein: 7.1 g/dL (ref 6.0–8.5)
eGFR: 111 mL/min/{1.73_m2} (ref >59)

## 2020-11-21 LAB — HEPATITIS B DNA, ULTRAQUANTITATIVE, PCR
HBV DNA SERPL PCR-ACNC: 11100 IU/mL
HBV DNA SERPL PCR-LOG IU: 4.045 log10 IU/mL

## 2020-11-22 ENCOUNTER — Encounter: Payer: Self-pay | Admitting: Family Medicine

## 2020-11-26 ENCOUNTER — Ambulatory Visit (HOSPITAL_COMMUNITY)
Admission: RE | Admit: 2020-11-26 | Discharge: 2020-11-26 | Disposition: A | Discharge status: Home / Self Care | Payer: Self-pay | Source: Ambulatory Visit | Attending: Family Medicine | Admitting: Family Medicine

## 2020-11-26 ENCOUNTER — Other Ambulatory Visit: Payer: Self-pay

## 2020-11-26 DIAGNOSIS — B181 Chronic viral hepatitis B without delta-agent: Secondary | ICD-10-CM | POA: Insufficient documentation

## 2020-12-05 ENCOUNTER — Encounter: Payer: Self-pay | Admitting: Family Medicine

## 2021-06-01 ENCOUNTER — Ambulatory Visit (INDEPENDENT_AMBULATORY_CARE_PROVIDER_SITE_OTHER): Payer: Medicaid Other | Admitting: Family Medicine

## 2021-06-01 ENCOUNTER — Other Ambulatory Visit: Payer: Self-pay

## 2021-06-01 ENCOUNTER — Encounter: Payer: Self-pay | Admitting: Family Medicine

## 2021-06-01 DIAGNOSIS — Z3009 Encounter for other general counseling and advice on contraception: Secondary | ICD-10-CM

## 2021-06-01 NOTE — Patient Instructions (Signed)
It was a pleasure to see you today!  Please take 1-2 ibuprofen prior to arrival for IUD replacement and removal on June 26, 2021, at 1:50 PM.     Be Well,  Dr. Chauncey Reading

## 2021-06-01 NOTE — Assessment & Plan Note (Signed)
Patient due for skyla removal. Will order and schedule.

## 2021-06-01 NOTE — Progress Notes (Signed)
° ° °  SUBJECTIVE:   CHIEF COMPLAINT / HPI:   BC: patient is currently using skyla for BC. This was placed in January 2020 in [place]. Patient does not have her card anymore, but specifically recalls month and year. She would like skyla to be removed and replaced. Will order it and schedule future appt.  PERTINENT  PMH / PSH: Skyla  OBJECTIVE:   BP 114/75    Pulse 77    Ht 5\' 6"  (1.676 m)    Wt 185 lb 6 oz (84.1 kg)    SpO2 100%    BMI 29.92 kg/m   Nursing note and vitals reviewed GEN: age-appropriate AW, resting comfortably in chair, NAD, WNWD  and appropriate   ASSESSMENT/PLAN:   Birth control counseling Patient due for skyla removal. Will order and schedule.      Gladys Damme, MD [place]

## 2021-06-26 ENCOUNTER — Encounter: Payer: Self-pay | Admitting: Family Medicine

## 2021-06-26 ENCOUNTER — Other Ambulatory Visit: Payer: Self-pay

## 2021-06-26 ENCOUNTER — Ambulatory Visit (INDEPENDENT_AMBULATORY_CARE_PROVIDER_SITE_OTHER): Payer: Medicaid Other | Admitting: Family Medicine

## 2021-06-26 ENCOUNTER — Other Ambulatory Visit (HOSPITAL_COMMUNITY)
Admission: RE | Admit: 2021-06-26 | Discharge: 2021-06-26 | Disposition: A | Discharge status: Home / Self Care | Payer: Medicaid Other | Source: Ambulatory Visit | Attending: Family Medicine | Admitting: Family Medicine

## 2021-06-26 VITALS — BP 127/70 | HR 73 | Wt 186.0 lb

## 2021-06-26 DIAGNOSIS — Z3200 Encounter for pregnancy test, result unknown: Secondary | ICD-10-CM | POA: Diagnosis not present

## 2021-06-26 DIAGNOSIS — Z0000 Encounter for general adult medical examination without abnormal findings: Secondary | ICD-10-CM | POA: Diagnosis not present

## 2021-06-26 DIAGNOSIS — Z124 Encounter for screening for malignant neoplasm of cervix: Secondary | ICD-10-CM

## 2021-06-26 DIAGNOSIS — T8332XA Displacement of intrauterine contraceptive device, initial encounter: Secondary | ICD-10-CM

## 2021-06-26 LAB — POCT URINE PREGNANCY: Preg Test, Ur: NEGATIVE

## 2021-06-26 NOTE — Assessment & Plan Note (Signed)
Pap smear performed today. 

## 2021-06-26 NOTE — Assessment & Plan Note (Signed)
Patient reports that she cannot feel her strings. No strings visualized on exam today to remove Skyla. Patient has no abdominal pain, abdominal exam WNL. Recommend pelvic US to locate IUD and will refer patient to OBGYN for removal.

## 2021-06-26 NOTE — Progress Notes (Addendum)
° ° °  SUBJECTIVE:   CHIEF COMPLAINT / HPI:   IUD: patient reports that she had a Skyla placed in January 2020 in [place]. She no longer has her card, but remembers the specific date and time. She requests removal and replacement of her Skyla today. She is monogamous with one female partner. Denies any symptoms of vaginal itching or discharge, declines STI testing. She does not remember her last pap smear, will perform today. Urine pregnancy test performed as IUD expired in January 2023.   PERTINENT  PMH / PSH: non-contributory  OBJECTIVE:   BP 127/70    Pulse 73    Wt 186 lb (84.4 kg)    LMP 06/11/2021 (Approximate)    Breastfeeding No    BMI 30.02 kg/m   Nursing note and vitals reviewed GEN: age-appropriate African woman, resting comfortably in chair, NAD, WNWD Abd: soft, NT, ND, normoactive bowel sounds. No HSM. PELVIC:  Normal appearing external female genitalia, normal vaginal epithelium, no abnormal discharge. Normal appearing cervix. Strings not visualized or felt on exam.  Neuro: AOx3  Ext: no edema Psych: Pleasant and appropriate   ASSESSMENT/PLAN:   IUD strings lost Patient reports that she cannot feel her strings. No strings visualized on exam today to remove Skyla. Patient has no abdominal pain, abdominal exam WNL. Recommend pelvic US to locate IUD and will refer patient to OBGYN for removal.  Healthcare maintenance Pap smear performed today     Gladys Damme, MD [place]

## 2021-06-26 NOTE — Patient Instructions (Signed)
It was a pleasure to see you today!  I could not find your IUD strings today. To find these, we will need you to get an ultrasound to locate the IUD. Then I will send you to OBGYN for removal and replacement of the IUD. I have placed a referral for gynecology to remove your IUD. You should receive a phone call in 1-2 weeks to schedule this appointment. If for any reason you do not receive a phone call or need more help scheduling this appointment, please call our office at [phone]   Be Well,  Dr. Chauncey Reading

## 2021-06-29 ENCOUNTER — Ambulatory Visit (HOSPITAL_BASED_OUTPATIENT_CLINIC_OR_DEPARTMENT_OTHER)
Admission: RE | Admit: 2021-06-29 | Discharge: 2021-06-29 | Disposition: A | Discharge status: Home / Self Care | Payer: Medicaid Other | Source: Ambulatory Visit | Attending: Family Medicine | Admitting: Family Medicine

## 2021-06-29 ENCOUNTER — Other Ambulatory Visit: Payer: Self-pay

## 2021-06-29 DIAGNOSIS — T8332XA Displacement of intrauterine contraceptive device, initial encounter: Secondary | ICD-10-CM | POA: Insufficient documentation

## 2021-06-29 LAB — CYTOLOGY - PAP
Adequacy: ABSENT
Comment: NEGATIVE
Diagnosis: NEGATIVE
High risk HPV: NEGATIVE

## 2021-06-30 ENCOUNTER — Telehealth: Payer: Self-pay | Admitting: Family Medicine

## 2021-06-30 ENCOUNTER — Other Ambulatory Visit: Payer: Self-pay | Admitting: Family Medicine

## 2021-06-30 DIAGNOSIS — T8332XS Displacement of intrauterine contraceptive device, sequela: Secondary | ICD-10-CM

## 2021-06-30 NOTE — Telephone Encounter (Signed)
Called patient regarding [place] results. Erica Hurst is in the correct position in the uterus, however no strings available in/out of the cervix to remove it. Also discussed pap smear, NILM, HPV negative. Next pap smear due February 2028. Will place referral to OBGYN.  Gladys Damme, MD [facility], PGY-3

## 2021-06-30 NOTE — Progress Notes (Signed)
See note from last week and [place] result. Patient needs referral to OB/GYN for removal of expired IUD that is in uterus but has no strings in cervix.  Gladys Damme, MD [facility], PGY-3

## 2021-07-07 NOTE — Progress Notes (Signed)
Patient came in for this procedure but the provider was unable to visualize the strings of the previous IUD. Patient has since been sent for ultrasound that resulted in her being referred to gynecology for removal of her IUD.  Skyla not used at her visit with [place] and is in the drawer.   Shira Bobst,CMA

## 2021-08-04 ENCOUNTER — Encounter (HOSPITAL_BASED_OUTPATIENT_CLINIC_OR_DEPARTMENT_OTHER): Payer: Self-pay | Admitting: Obstetrics & Gynecology

## 2021-08-04 ENCOUNTER — Ambulatory Visit (INDEPENDENT_AMBULATORY_CARE_PROVIDER_SITE_OTHER): Payer: Medicaid Other | Admitting: Obstetrics & Gynecology

## 2021-08-04 ENCOUNTER — Other Ambulatory Visit: Payer: Self-pay

## 2021-08-04 VITALS — BP 127/84 | HR 74 | Ht 65.0 in | Wt 184.2 lb

## 2021-08-04 DIAGNOSIS — T8332XA Displacement of intrauterine contraceptive device, initial encounter: Secondary | ICD-10-CM | POA: Diagnosis not present

## 2021-08-04 DIAGNOSIS — B181 Chronic viral hepatitis B without delta-agent: Secondary | ICD-10-CM

## 2021-08-07 NOTE — Progress Notes (Signed)
GYNECOLOGY  VISIT ? ?CC:   IUD removal and replacement ? ?HPI: ?41 y.o. [id number] Married Other or two or more races female here for removal of IUD and replacement.  Has a Isla Pence IUD that was placed 05/2018 (in [place]) per pt.  She no longer has her card but is confident of the date.  She knows that it should be removed after 3 years.  Was seen by Dr. Apolinar Junes for removal.  String not visualized.  Ultrasound performed showing IUD in proper location.  Risks and benefits of removal and replacement discussed today.  All questions answered. ? ?Pap 06/26/2021 neg with neg HR HPV. ? ? ? ?Patient Active Problem List  ? Diagnosis Date Noted  ? IUD strings lost 06/26/2021  ? Healthcare maintenance 06/26/2021  ? Birth control counseling 06/01/2021  ? Chronic hepatitis B ([place]) 11/19/2020  ? S/P cesarean section 05/18/2014  ? Hepatitis B affecting pregnancy in first trimester 11/23/2013  ? ? ?Past Medical History:  ?Diagnosis Date  ? Fibroid   ? Hepatitis B affecting pregnancy   ? ? ?Past Surgical History:  ?Procedure Laterality Date  ? CESAREAN SECTION N/A 05/15/2014  ? Procedure: CESAREAN SECTION;  Surgeon: Delice Lesch, MD;  Location: [facility];  Service: Obstetrics;  Laterality: N/A;  ? DILITATION & CURRETTAGE/HYSTROSCOPY WITH VERSAPOINT RESECTION N/A 06/30/2012  ? Procedure: DILATATION & CURETTAGE/HYSTEROSCOPY WITH VERSAPOINT RESECTION;  Surgeon: Princess Bruins, MD;  Location: [facility];  Service: Gynecology;  Laterality: N/A;  ? ? ?MEDS:   ?No current outpatient medications on file prior to visit.  ? ?No current facility-administered medications on file prior to visit.  ? ? ?ALLERGIES: Patient has no known allergies. ? ?Family History  ?Problem Relation Age of Onset  ? Liver disease Neg Hx   ? ? ?SH:  married, non smoker ? ?Review of Systems  ?All other systems reviewed and are negative. ? ?PHYSICAL EXAMINATION:   ? ?BP 127/84 (BP Location: Right Arm, Patient Position: Sitting, Cuff Size: Normal)   Pulse 74   Ht 5'  5" (1.651 m) Comment: reported  Wt 184 lb 3.2 oz (83.6 kg)   BMI 30.65 kg/m?     ?General appearance: alert, cooperative and appears stated age2 ?Lymph:  no inguinal LAD noted ? ?Pelvic: External genitalia:  no lesions ?             Urethra:  normal appearing urethra with no masses, tenderness or lesions ?             Bartholins and Skenes: normal    ?             Vagina: normal appearing vagina with normal color and discharge, no lesions ?             Cervix: no lesions and IUD string not noted ?             Bimanual Exam:  Uterus:  normal size, contour, position, consistency, mobility, non-tender ?             Adnexa: no mass, fullness, tenderness ? ?Procedure:  speculum placed.  Cervix cleansed with Betadine x 3.  Anterior lip of cervix grasped with single toothed tenaculum.  Endometrial curette passed through cervix until resistance encounter.  Device rotate clockwise 360 degrees and removed.  No string or IUD with instrument.  This was attempted three additional times without success.  As I was unsure that I could pass the curette any deeper without uterine injury, procedure ended.  Tenaculum removed.  Minimal bleeding noted.  Speculum removed.  Pt tolerated procedure well.  ? ?D/w pt attempted removal in the office again with ultrasound guidance or in the operating room with hysteroscopy.  Pt desires removal in the OR.  Will proceed with scheduling.   ? ?Chaperone, Octaviano Batty, CMA, was present for exam. ? ?Assessment/Plan: ?1. Intrauterine contraceptive device threads lost, initial encounter ?- Removal attempted today and this was not successful. ?- will need to proceed with removal in OR.  Pt is deciding whether she wants IUD replaced or not. ? ?2. Chronic hepatitis B ([place]) ? ? ? ?

## 2021-08-11 ENCOUNTER — Telehealth (HOSPITAL_BASED_OUTPATIENT_CLINIC_OR_DEPARTMENT_OTHER): Payer: Self-pay | Admitting: Obstetrics & Gynecology

## 2021-08-11 NOTE — Telephone Encounter (Signed)
Patient husband called and said they are returning someone call from here. ?

## 2021-09-22 ENCOUNTER — Telehealth (HOSPITAL_BASED_OUTPATIENT_CLINIC_OR_DEPARTMENT_OTHER): Payer: Self-pay | Admitting: Obstetrics & Gynecology

## 2021-09-22 NOTE — Telephone Encounter (Signed)
Patient came in today and want to schedule her surgery IUD remove and replace for 3 years preferred on Wednesday. ?

## 2021-09-25 ENCOUNTER — Encounter (HOSPITAL_BASED_OUTPATIENT_CLINIC_OR_DEPARTMENT_OTHER): Payer: Self-pay

## 2021-09-25 ENCOUNTER — Other Ambulatory Visit (HOSPITAL_BASED_OUTPATIENT_CLINIC_OR_DEPARTMENT_OTHER): Payer: Self-pay | Admitting: Obstetrics & Gynecology

## 2021-09-25 ENCOUNTER — Telehealth: Payer: Self-pay

## 2021-09-25 DIAGNOSIS — Z01818 Encounter for other preprocedural examination: Secondary | ICD-10-CM

## 2021-09-25 NOTE — Telephone Encounter (Signed)
Called patient with pacific interpreters [employer]mer Mor HS#[phone], patient could speak and understand [place]. Patient chose 05/24 surgery date. Location, time and preop instructions given, patient expressed understanding. ?

## 2021-09-25 NOTE — Telephone Encounter (Signed)
-----   Message from Megan Salon, MD sent at 09/22/2021  5:00 PM EDT ----- ?Regarding: surgery ?Drema Balzarine, ?This pt needs hysteroscopy with IUD removal and replacement due to lost iud strings and contraception.  I do not need an Environmental consultant.  Pt needs a Wednesday if possible  Thank you.. ? ?Dr. Sabra Heck ? ?

## 2021-10-06 ENCOUNTER — Encounter (HOSPITAL_BASED_OUTPATIENT_CLINIC_OR_DEPARTMENT_OTHER): Payer: Self-pay | Admitting: Obstetrics & Gynecology

## 2021-10-06 ENCOUNTER — Other Ambulatory Visit: Payer: Self-pay

## 2021-10-06 NOTE — Progress Notes (Signed)
Spoke w/ via phone for pre-op interview---pt Lab needs dos----urine poct preg               Lab results------none COVID test -----patient states asymptomatic no test needed Arrive at -------900 am 10-07-2021 NPO after MN NO Solid Food.  Water  from MN until---800 am Med rec completed Medications to take morning of surgery -----none Diabetic medication -----n/a Patient instructed no nail polish to be worn day of surgery Patient instructed to bring photo id and insurance card day of surgery Patient aware to have Driver (ride ) / caregiver   spouse anwar will drop pt off  for 24 hours after surgery  Patient Special Instructions -----none Pre-Op special Istructions -----none Patient verbalized understanding of instructions that were given at this phone interview. Patient denies shortness of breath, chest pain, fever, cough at this phone interview.   Patient speaks english well and states she does not need french interpreter for day of surgery

## 2021-10-07 ENCOUNTER — Ambulatory Visit (HOSPITAL_BASED_OUTPATIENT_CLINIC_OR_DEPARTMENT_OTHER)
Admission: RE | Admit: 2021-10-07 | Discharge: 2021-10-07 | Disposition: A | Discharge status: Home / Self Care | Payer: Medicaid Other | Attending: Obstetrics & Gynecology | Admitting: Obstetrics & Gynecology

## 2021-10-07 ENCOUNTER — Other Ambulatory Visit: Payer: Self-pay

## 2021-10-07 ENCOUNTER — Ambulatory Visit (HOSPITAL_BASED_OUTPATIENT_CLINIC_OR_DEPARTMENT_OTHER): Payer: Medicaid Other | Admitting: Anesthesiology

## 2021-10-07 ENCOUNTER — Encounter (HOSPITAL_BASED_OUTPATIENT_CLINIC_OR_DEPARTMENT_OTHER)
Admission: RE | Disposition: A | Discharge status: Home / Self Care | Payer: Self-pay | Source: Home / Self Care | Attending: Obstetrics & Gynecology

## 2021-10-07 ENCOUNTER — Encounter (HOSPITAL_BASED_OUTPATIENT_CLINIC_OR_DEPARTMENT_OTHER): Payer: Self-pay | Admitting: Obstetrics & Gynecology

## 2021-10-07 DIAGNOSIS — Z538 Procedure and treatment not carried out for other reasons: Secondary | ICD-10-CM

## 2021-10-07 DIAGNOSIS — T8332XD Displacement of intrauterine contraceptive device, subsequent encounter: Secondary | ICD-10-CM

## 2021-10-07 DIAGNOSIS — Z975 Presence of (intrauterine) contraceptive device: Secondary | ICD-10-CM

## 2021-10-07 DIAGNOSIS — Z01818 Encounter for other preprocedural examination: Secondary | ICD-10-CM

## 2021-10-07 DIAGNOSIS — T8332XA Displacement of intrauterine contraceptive device, initial encounter: Secondary | ICD-10-CM | POA: Diagnosis present

## 2021-10-07 DIAGNOSIS — Z8619 Personal history of other infectious and parasitic diseases: Secondary | ICD-10-CM | POA: Diagnosis not present

## 2021-10-07 DIAGNOSIS — X58XXXA Exposure to other specified factors, initial encounter: Secondary | ICD-10-CM | POA: Diagnosis not present

## 2021-10-07 DIAGNOSIS — Z0000 Encounter for general adult medical examination without abnormal findings: Secondary | ICD-10-CM

## 2021-10-07 DIAGNOSIS — Z30430 Encounter for insertion of intrauterine contraceptive device: Secondary | ICD-10-CM

## 2021-10-07 HISTORY — PX: INTRAUTERINE DEVICE (IUD) INSERTION: SHX5877

## 2021-10-07 HISTORY — PX: HYSTEROSCOPY WITH D & C: SHX1775

## 2021-10-07 LAB — CBC
HCT: 42 % (ref 36.0–46.0)
Hemoglobin: 14.3 g/dL (ref 12.0–15.0)
MCH: 29 pg (ref 26.0–34.0)
MCHC: 34 g/dL (ref 30.0–36.0)
MCV: 85.2 fL (ref 80.0–100.0)
Platelets: 212 10*3/uL (ref 150–400)
RBC: 4.93 MIL/uL (ref 3.87–5.11)
RDW: 13.2 % (ref 11.5–15.5)
WBC: 6.7 10*3/uL (ref 4.0–10.5)
nRBC: 0 % (ref 0.0–0.2)

## 2021-10-07 LAB — POCT PREGNANCY, URINE: Preg Test, Ur: NEGATIVE

## 2021-10-07 SURGERY — DILATATION AND CURETTAGE /HYSTEROSCOPY
Anesthesia: General | Site: Uterus

## 2021-10-07 MED ORDER — EPHEDRINE 5 MG/ML INJ
INTRAVENOUS | Status: AC
  Filled 2021-10-07: qty 5

## 2021-10-07 MED ORDER — LEVONORGESTREL 20 MCG/DAY IU IUD: 1.0000 | INTRAUTERINE_SYSTEM | INTRAUTERINE | Status: DC

## 2021-10-07 MED ORDER — SCOPOLAMINE 1 MG/3DAYS TD PT72
1.0000 | MEDICATED_PATCH | Freq: Once | TRANSDERMAL | Status: DC
  Administered 2021-10-07: 1.5 mg via TRANSDERMAL

## 2021-10-07 MED ORDER — EPHEDRINE SULFATE-NACL 50-0.9 MG/10ML-% IV SOSY
PREFILLED_SYRINGE | INTRAVENOUS | Status: DC | PRN
  Administered 2021-10-07: 10 mg via INTRAVENOUS

## 2021-10-07 MED ORDER — GLYCOPYRROLATE PF 0.2 MG/ML IJ SOSY
PREFILLED_SYRINGE | INTRAMUSCULAR | Status: DC | PRN
  Administered 2021-10-07: .2 mg via INTRAVENOUS

## 2021-10-07 MED ORDER — FENTANYL CITRATE (PF) 100 MCG/2ML IJ SOLN
INTRAMUSCULAR | Status: DC | PRN
Start: 2021-10-07 — End: 2021-10-07
  Administered 2021-10-07: 50 ug via INTRAVENOUS

## 2021-10-07 MED ORDER — KETOROLAC TROMETHAMINE 30 MG/ML IJ SOLN
INTRAMUSCULAR | Status: AC
  Filled 2021-10-07: qty 1

## 2021-10-07 MED ORDER — DEXAMETHASONE SODIUM PHOSPHATE 10 MG/ML IJ SOLN
INTRAMUSCULAR | Status: DC | PRN
  Administered 2021-10-07: 4 mg via INTRAVENOUS

## 2021-10-07 MED ORDER — ONDANSETRON HCL 4 MG/2ML IJ SOLN
INTRAMUSCULAR | Status: AC
  Filled 2021-10-07: qty 2

## 2021-10-07 MED ORDER — IBUPROFEN 800 MG PO TABS: 800.0000 mg | ORAL_TABLET | Freq: Three times a day (TID) | ORAL | 0 refills | Status: AC | PRN

## 2021-10-07 MED ORDER — AMISULPRIDE (ANTIEMETIC) 5 MG/2ML IV SOLN: 10.0000 mg | Freq: Once | INTRAVENOUS | Status: DC | PRN

## 2021-10-07 MED ORDER — DEXAMETHASONE SODIUM PHOSPHATE 10 MG/ML IJ SOLN
INTRAMUSCULAR | Status: AC
  Filled 2021-10-07: qty 1

## 2021-10-07 MED ORDER — FENTANYL CITRATE (PF) 100 MCG/2ML IJ SOLN
INTRAMUSCULAR | Status: AC
  Filled 2021-10-07: qty 2

## 2021-10-07 MED ORDER — LIDOCAINE-EPINEPHRINE 1 %-1:100000 IJ SOLN
INTRAMUSCULAR | Status: DC | PRN
  Administered 2021-10-07: 10 mL

## 2021-10-07 MED ORDER — ONDANSETRON HCL 4 MG/2ML IJ SOLN
INTRAMUSCULAR | Status: DC | PRN
  Administered 2021-10-07: 4 mg via INTRAVENOUS

## 2021-10-07 MED ORDER — GLYCOPYRROLATE PF 0.2 MG/ML IJ SOSY
PREFILLED_SYRINGE | INTRAMUSCULAR | Status: AC
  Filled 2021-10-07: qty 1

## 2021-10-07 MED ORDER — ACETAMINOPHEN 500 MG PO TABS
1000.0000 mg | ORAL_TABLET | Freq: Once | ORAL | Status: AC
  Administered 2021-10-07: 1000 mg via ORAL

## 2021-10-07 MED ORDER — LIDOCAINE 2% (20 MG/ML) 5 ML SYRINGE
INTRAMUSCULAR | Status: DC | PRN
  Administered 2021-10-07: 100 mg via INTRAVENOUS

## 2021-10-07 MED ORDER — MIDAZOLAM HCL 2 MG/2ML IJ SOLN
INTRAMUSCULAR | Status: AC
  Filled 2021-10-07: qty 2

## 2021-10-07 MED ORDER — PROPOFOL 10 MG/ML IV BOLUS
INTRAVENOUS | Status: AC
  Filled 2021-10-07: qty 20

## 2021-10-07 MED ORDER — SCOPOLAMINE 1 MG/3DAYS TD PT72
MEDICATED_PATCH | TRANSDERMAL | Status: AC
  Filled 2021-10-07: qty 1

## 2021-10-07 MED ORDER — PROPOFOL 10 MG/ML IV BOLUS
INTRAVENOUS | Status: DC | PRN
  Administered 2021-10-07: 200 mg via INTRAVENOUS

## 2021-10-07 MED ORDER — MIDAZOLAM HCL 2 MG/2ML IJ SOLN
INTRAMUSCULAR | Status: DC | PRN
  Administered 2021-10-07: 2 mg via INTRAVENOUS

## 2021-10-07 MED ORDER — OXYCODONE HCL 5 MG/5ML PO SOLN: 5.0000 mg | Freq: Once | ORAL | Status: DC | PRN

## 2021-10-07 MED ORDER — SODIUM CHLORIDE 0.9 % IR SOLN
Status: DC | PRN
  Administered 2021-10-07: 3000 mL

## 2021-10-07 MED ORDER — FENTANYL CITRATE (PF) 100 MCG/2ML IJ SOLN: 25.0000 ug | INTRAMUSCULAR | Status: DC | PRN

## 2021-10-07 MED ORDER — OXYCODONE HCL 5 MG PO TABS: 5.0000 mg | ORAL_TABLET | Freq: Once | ORAL | Status: DC | PRN

## 2021-10-07 MED ORDER — LACTATED RINGERS IV SOLN: INTRAVENOUS | Status: DC

## 2021-10-07 MED ORDER — ACETAMINOPHEN 500 MG PO TABS
ORAL_TABLET | ORAL | Status: AC
  Filled 2021-10-07: qty 2

## 2021-10-07 MED ORDER — KETOROLAC TROMETHAMINE 30 MG/ML IJ SOLN
INTRAMUSCULAR | Status: DC | PRN
  Administered 2021-10-07: 30 mg via INTRAVENOUS

## 2021-10-07 MED ORDER — LIDOCAINE HCL (PF) 2 % IJ SOLN
INTRAMUSCULAR | Status: AC
  Filled 2021-10-07: qty 5

## 2021-10-07 SURGICAL SUPPLY — 28 items
BIPOLAR CUTTING LOOP 21FR (ELECTRODE)
CATH ROBINSON RED A/P 16FR (CATHETERS) ×2 IMPLANT
DEVICE MYOSURE LITE (MISCELLANEOUS) IMPLANT
DEVICE MYOSURE REACH (MISCELLANEOUS) IMPLANT
DILATOR CANAL MILEX (MISCELLANEOUS) ×1 IMPLANT
DRSG TELFA 3X8 NADH (GAUZE/BANDAGES/DRESSINGS) ×2 IMPLANT
GAUZE 4X4 16PLY ~~LOC~~+RFID DBL (SPONGE) ×4 IMPLANT
GLOVE BIO SURGEON STRL SZ 6.5 (GLOVE) ×2 IMPLANT
GLOVE BIOGEL PI IND STRL 7.0 (GLOVE) ×3 IMPLANT
GLOVE BIOGEL PI INDICATOR 7.0 (GLOVE) ×3
GLOVE ECLIPSE 6.5 STRL STRAW (GLOVE) ×4 IMPLANT
GOWN STRL REUS W/TWL LRG LVL3 (GOWN DISPOSABLE) ×4 IMPLANT
HIBICLENS CHG 4% 4OZ BTL (MISCELLANEOUS) ×2 IMPLANT
IV NS IRRIG 3000ML ARTHROMATIC (IV SOLUTION) ×2 IMPLANT
KIT PROCEDURE FLUENT (KITS) ×2 IMPLANT
KIT TURNOVER CYSTO (KITS) ×2 IMPLANT
LOOP CUTTING BIPOLAR 21FR (ELECTRODE) IMPLANT
NDL SPNL 22GX3.5 QUINCKE BK (NEEDLE) ×1 IMPLANT
NEEDLE SPNL 22GX3.5 QUINCKE BK (NEEDLE) ×2 IMPLANT
PACK VAGINAL MINOR WOMEN LF (CUSTOM PROCEDURE TRAY) ×2 IMPLANT
PAD DRESSING TELFA 3X8 NADH (GAUZE/BANDAGES/DRESSINGS) ×1 IMPLANT
PAD OB MATERNITY 4.3X12.25 (PERSONAL CARE ITEMS) ×2 IMPLANT
PAD PREP 24X48 CUFFED NSTRL (MISCELLANEOUS) ×2 IMPLANT
SEAL CERVICAL OMNI LOK (ABLATOR) IMPLANT
SEAL ROD LENS SCOPE MYOSURE (ABLATOR) ×2 IMPLANT
Skyla IUD, 13.5 MG ×1 IMPLANT
TOWEL OR 17X26 10 PK STRL BLUE (TOWEL DISPOSABLE) ×4 IMPLANT
WATER STERILE IRR 500ML POUR (IV SOLUTION) ×2 IMPLANT

## 2021-10-07 NOTE — H&P (Addendum)
Erica Hurst is an 41 y.o. female G3A1 with retained IUD and non visualized IUD string here for hysteroscopy with removal of IUD and placement of new Mirena IUD.  She has failed outpatient attempt at removal and did not want to attempt removal in the office with ultrasound guidance.  Decided to have this done under anesthesia.  Procedure, risks and benefits have been reviewed including bleeding, infection, risks of uterine preformation, malpositioned IUD, irregular bleeding, need for additional procedures.  Questions answered.    Pertinent Gynecological History: Contraception: IUD DES exposure: denies Blood transfusions: none Sexually transmitted diseases: h/o hep b Previous GYN Procedures:  IUD placement, cesarean section   Last mammogram:  n/a   Last pap: normal Date: 06/2021 OB History: G3, P1   Menstrual History: Patient's last menstrual period was 08/26/2021 (approximate).    Past Medical History:  Diagnosis Date   Fibroid    Hepatitis B affecting pregnancy     Past Surgical History:  Procedure Laterality Date   CESAREAN SECTION N/A 05/15/2014   Procedure: CESAREAN SECTION;  Surgeon: Delice Lesch, MD;  Location: [facility];  Service: Obstetrics;  Laterality: N/A;   DILITATION & CURRETTAGE/HYSTROSCOPY WITH VERSAPOINT RESECTION N/A 06/30/2012   Procedure: DILATATION & CURETTAGE/HYSTEROSCOPY WITH VERSAPOINT RESECTION;  Surgeon: Princess Bruins, MD;  Location: [facility];  Service: Gynecology;  Laterality: N/A;    Family History  Problem Relation Age of Onset   Liver disease Neg Hx     Social History:  reports that she has never smoked. She has never used smokeless tobacco. She reports that she does not drink alcohol and does not use drugs.  Allergies: No Known Allergies  Medications Prior to Admission  Medication Sig Dispense Refill Last Dose   Levonorgestrel (SKYLA) 13.5 MG IUD by Intrauterine route. Placed 05-2018       Review of Systems  All other systems reviewed  and are negative.  Height '5\' 5"'$  (1.651 m), weight 79.4 kg, last menstrual period 08/26/2021. Physical Exam Constitutional:      Appearance: Normal appearance.  Cardiovascular:     Heart sounds: Normal heart sounds.  Pulmonary:     Effort: Pulmonary effort is normal.     Breath sounds: Normal breath sounds.  Neurological:     General: No focal deficit present.     Mental Status: She is alert.  Psychiatric:        Mood and Affect: Mood normal.    No results found for this or any previous visit (from the past 24 hour(s)).  No results found.  Assessment/Plan: 41 yo G3P1 female here for hysteroscopy with removal of non visualized IUD string and failed outpatient/office removal.  Desires new IUD and this will be placed today as well.  Pt and I discussed which IUD this morning.  She has a [place] IUD and my understanding in the office was that she desired an IUD with longer indication.  Today, she does not desire Mirena IUD and desires to have the same IUD that was previously placed--a Skyla IUD.  This conversation occurred in front of [facility], [place], in pre-op.  Skyla IUD not stocked in pharmacy.  This is a recent decision change in pharmacy that [facility] [facility] staff and I did not know.  Also, it is not in the outpatient pharmacies either.  Pt and OR staff aware I have this in [place] office.  Obtained from office stock and now ready to proceed.  Confirmed again with pt that she desires Skyla, 3 year, IUD and  she is in agreement.  Megan Salon 10/07/2021, 9:01 AM

## 2021-10-07 NOTE — Anesthesia Postprocedure Evaluation (Signed)
Anesthesia Post Note  Patient: Erica Hurst  Procedure(s) Performed: DILATATION, HYSTEROSCOPY AND IUD REMOVAL (Uterus) INTRAUTERINE DEVICE (IUD) INSERTION (Uterus)     Patient location during evaluation: PACU Anesthesia Type: General Level of consciousness: awake and alert Pain management: pain level controlled Vital Signs Assessment: post-procedure vital signs reviewed and stable Respiratory status: spontaneous breathing, nonlabored ventilation and respiratory function stable Cardiovascular status: blood pressure returned to baseline Postop Assessment: no apparent nausea or vomiting Anesthetic complications: no   No notable events documented.  Last Vitals:  Vitals:   10/07/21 1230 10/07/21 1240  BP: 117/68 111/73  Pulse:  70  Resp: 20 17  Temp:  36.5 C  SpO2:  99%    Last Pain:  Vitals:   10/07/21 1240  TempSrc:   PainSc: 0-No pain                 Marthenia Rolling

## 2021-10-07 NOTE — Discharge Instructions (Addendum)
Post-surgical Instructions, Outpatient Surgery  You may expect to feel dizzy, weak, and drowsy for as long as 24 hours after receiving the medicine that made you sleep (anesthetic). For the first 24 hours after your surgery:   Do not drive a car, ride a bicycle, participate in physical activities, or take public transportation until you are done taking narcotic pain medicines or as directed by Dr. Sabra Heck.  Do not drink alcohol or take tranquilizers.  Do not take medicine that has not been prescribed by your physicians.  Do not sign important papers or make important decisions while on narcotic pain medicines.  Have a responsible person with you.   PAIN MANAGEMENT Motrin '800mg'$ .  (This is the same as 4-'200mg'$  over the counter tablets of Motrin or ibuprofen.)  You may take this every eight hours or as needed for cramping.    DO'S AND DON'T'S Do not take a tub bath for one week.  You may shower on the first day after your surgery Do not do any heavy lifting for one to two weeks.  This increases the chance of bleeding. Do move around as you feel able.  Stairs are fine.  You may begin to exercise again as you feel able.  Do not lift any weights for two weeks. Do not put anything in the vagina for tone week--no tampons, intercourse, or douching.    REGULAR MEDIATIONS/VITAMINS: You may restart all of your regular medications as prescribed. You may restart all of your vitamins as you normally take them.    PLEASE CALL OR SEEK MEDICAL CARE IF: You have persistent nausea and vomiting.  You have trouble eating or drinking.  You have an oral temperature above 100.5.  You have constipation that is not helped by adjusting diet or increasing fluid intake. Pain medicines are a common cause of constipation.  You have heavy vaginal bleeding   Post Anesthesia Home Care Instructions  Activity: Get plenty of rest for the remainder of the day. A responsible individual must stay with you for 24 hours  following the procedure.  For the next 24 hours, DO NOT: -Drive a car -Paediatric nurse -Drink alcoholic beverages -Take any medication unless instructed by your physician -Make any legal decisions or sign important papers.  Meals: Start with liquid foods such as gelatin or soup. Progress to regular foods as tolerated. Avoid greasy, spicy, heavy foods. If nausea and/or vomiting occur, drink only clear liquids until the nausea and/or vomiting subsides. Call your physician if vomiting continues.  Special Instructions/Symptoms: Your throat may feel dry or sore from the anesthesia or the breathing tube placed in your throat during surgery. If this causes discomfort, gargle with warm salt water. The discomfort should disappear within 24 hours.  If you had a scopolamine patch placed behind your ear for the management of post- operative nausea and/or vomiting:  1. The medication in the patch is effective for 72 hours, after which it should be removed.  Wrap patch in a tissue and discard in the trash. Wash hands thoroughly with soap and water. 2. You may remove the patch earlier than 72 hours if you experience unpleasant side effects which may include dry mouth, dizziness or visual disturbances. 3. Avoid touching the patch. Wash your hands with soap and water after contact with the patch.     Remove patch by Saturday May 27.  No acetaminophen/Tylenol until after 3:30 pm today if needed.  No ibuprofen, Advil, Aleve, Motrin, ketorolac, meloxicam, naproxen, or other NSAIDS until  after 6pm today if needed.

## 2021-10-07 NOTE — Anesthesia Procedure Notes (Signed)
Procedure Name: LMA Insertion Date/Time: 10/07/2021 11:39 AM Performed by: Mechele Claude, CRNA Pre-anesthesia Checklist: Patient identified, Emergency Drugs available, Suction available and Patient being monitored Patient Re-evaluated:Patient Re-evaluated prior to induction Oxygen Delivery Method: Circle system utilized Preoxygenation: Pre-oxygenation with 100% oxygen Induction Type: IV induction Ventilation: Mask ventilation without difficulty LMA: LMA inserted LMA Size: 4.0 Number of attempts: 1 Airway Equipment and Method: Bite block Placement Confirmation: positive ETCO2 Tube secured with: Tape Dental Injury: Teeth and Oropharynx as per pre-operative assessment

## 2021-10-07 NOTE — Op Note (Signed)
10/07/2021  12:02 PM  PATIENT:  Erica Hurst  41 y.o. female  PRE-OPERATIVE DIAGNOSIS:  IUD lost strings, inability to remove IUD in office setting, encounter for Contraception  POST-OPERATIVE DIAGNOSIS:  IUD lost strings, inability to remove IUD in office setting, encounter for Contraception  PROCEDURE:  Procedure(s): DILATATION, HYSTEROSCOPY AND IUD REMOVAL INTRAUTERINE DEVICE (IUD) INSERTION  SURGEON:  Megan Salon  ASSISTANTS: OR staff.    ANESTHESIA:   general  ESTIMATED BLOOD LOSS: 5cc  BLOOD ADMINISTERED:none   FLUIDS: 800cc LR  UOP: 5 cc drained with I&O cath at beginning of procedure  SPECIMEN:  none, IUD was discarded  DISPOSITION OF SPECIMEN:  N/A  FINDINGS: IUD strings curled inside cervix, IUD removed intact  DESCRIPTION OF OPERATION: Patient was taken to the operating room.  She is placed in the supine position. SCDs were on her lower extremities and functioning properly. General anesthesia with an LMA was administered without difficulty. Dr. Daiva Huge, anesthesia, oversaw case.  Time out performed.  Surgical consent stated placement of Mirena IUD.  This morning pt stated she desired to have the 3 year, Skyla, used.  This conversation was witnessed by Olivia Mackie, Therapist, sports, in pre-op.  Pt consent was not updated and she had received sedation before this was realized.  Safety zone portal will be placed.  I felt very comfortable proceeding as she and I had discussed this prior to procedure and Skyla IUD had to be obtained and was also shown to pt before going back to the OR.  Legs were then placed in the [place] in the low lithotomy position. The legs were lifted to the high lithotomy position and the Betadine prep was used on the inner thighs perineum and vagina x3. Patient was draped in a normal standard fashion. An in and out catheterization with a red rubber Foley catheter was performed. Approximately 5 cc of clear urine was noted. A bivalve speculum was placed the  vagina. The anterior lip of the cervix was grasped with single-tooth tenaculum.  A paracervical block of 1% lidocaine mixed one-to-one with epinephrine (1:100,000 units).  10 cc was used total. The cervix is dilated up to #[facility] dilators. The endometrial cavity sounded to 8 cm.   A 2.9 millimeter diagnostic hysteroscope was obtained. Normal saline was used as a hysteroscopic fluid. The hysteroscope was advanced through the endocervical canal and the IUD string was noted curled in the cervix.  Hysteroscope was removed.  Polyp forceps obtained and string grasped and IUD removed, intact, with one pull.  Then hysteroscope was advanced into the endometrial cavity.  Endometrium was thin.  The tubal ostia were noted bilaterally.  No abnormal findings were noted.  The hysteroscope was removed.   Then IUD and introducer were obtained.  This was passed easily to the fundus, slightly withdrawn and IUD deployed inside the endometrial cavity.  Introducer removed and string cut to 2cm.  At this point, the procedure was ended.  The fluid deficit was minimal. The tenaculum was removed from the anterior lip of the cervix. The speculum was removed from the vagina. The prep was cleansed of the patient's skin. The legs are positioned back in the supine position. Sponge, lap, needle, initially counts were correct x2. Patient was taken to recovery in stable condition.   COUNTS:  YES  PLAN OF CARE: Transfer to PACU

## 2021-10-07 NOTE — Anesthesia Preprocedure Evaluation (Addendum)
Anesthesia Evaluation  Patient identified by MRN, date of birth, ID band Patient awake    Reviewed: Allergy & Precautions, NPO status , Patient's Chart, lab work & pertinent test results  History of Anesthesia Complications Negative for: history of anesthetic complications  Airway Mallampati: II  TM Distance: >3 FB Neck ROM: Full    Dental no notable dental hx.    Pulmonary neg pulmonary ROS,    Pulmonary exam normal        Cardiovascular negative cardio ROS Normal cardiovascular exam     Neuro/Psych negative neurological ROS  negative psych ROS   GI/Hepatic negative GI ROS, (+) Hepatitis -, B  Endo/Other  negative endocrine ROS  Renal/GU negative Renal ROS  negative genitourinary   Musculoskeletal negative musculoskeletal ROS (+)   Abdominal   Peds  Hematology negative hematology ROS (+)   Anesthesia Other Findings Day of surgery medications reviewed with patient.  Reproductive/Obstetrics Lost IUD strings                            Anesthesia Physical Anesthesia Plan  ASA: 2  Anesthesia Plan: General   Post-op Pain Management: Tylenol PO (pre-op)* and Toradol IV (intra-op)*   Induction: Intravenous  PONV Risk Score and Plan: 3 and Treatment may vary due to age or medical condition, Midazolam, Dexamethasone, Ondansetron and Scopolamine patch - Pre-op  Airway Management Planned: LMA  Additional Equipment: None  Intra-op Plan:   Post-operative Plan: Extubation in OR  Informed Consent: I have reviewed the patients History and Physical, chart, labs and discussed the procedure including the risks, benefits and alternatives for the proposed anesthesia with the patient or authorized representative who has indicated his/her understanding and acceptance.     Dental advisory given  Plan Discussed with: CRNA  Anesthesia Plan Comments:        Anesthesia Quick Evaluation

## 2021-10-07 NOTE — Transfer of Care (Signed)
Immediate Anesthesia Transfer of Care Note  Patient: Erica Hurst  Procedure(s) Performed: Procedure(s) (LRB): DILATATION, HYSTEROSCOPY AND IUD REMOVAL (N/A) INTRAUTERINE DEVICE (IUD) INSERTION (N/A)  Patient Location: PACU  Anesthesia Type: General  Level of Consciousness: awake, alert  and oriented  Airway & Oxygen Therapy: Patient Spontanous Breathing  Post-op Assessment: Report given to PACU RN and Post -op Vital signs reviewed and stable  Post vital signs: Reviewed and stable  Complications: No apparent anesthesia complications  Last Vitals:  Vitals Value Taken Time  BP 124/72 10/07/21 1215  Temp 36.6 C 10/07/21 1213  Pulse 85 10/07/21 1215  Resp 18 10/07/21 1216  SpO2 99 % 10/07/21 1215  Vitals shown include unvalidated device data.  Last Pain:  Vitals:   10/07/21 0921  TempSrc: Oral  PainSc: 0-No pain      Patients Stated Pain Goal: 3 (16/10/96 0454)  Complications: No notable events documented.

## 2021-10-08 ENCOUNTER — Encounter (HOSPITAL_BASED_OUTPATIENT_CLINIC_OR_DEPARTMENT_OTHER): Payer: Self-pay | Admitting: Obstetrics & Gynecology

## 2021-11-02 ENCOUNTER — Encounter (HOSPITAL_BASED_OUTPATIENT_CLINIC_OR_DEPARTMENT_OTHER): Payer: Medicaid Other | Admitting: Obstetrics & Gynecology

## 2021-11-02 ENCOUNTER — Telehealth (HOSPITAL_BASED_OUTPATIENT_CLINIC_OR_DEPARTMENT_OTHER): Payer: Self-pay | Admitting: Obstetrics & Gynecology

## 2021-11-02 NOTE — Telephone Encounter (Signed)
Translator called patient and left a message to please call the office about her missed appointment today .

## 2022-05-04 ENCOUNTER — Ambulatory Visit (INDEPENDENT_AMBULATORY_CARE_PROVIDER_SITE_OTHER): Payer: Medicaid Other | Admitting: Family Medicine

## 2022-05-04 ENCOUNTER — Encounter: Payer: Self-pay | Admitting: Family Medicine

## 2022-05-04 VITALS — BP 107/75 | HR 75 | Temp 98.5°F | Ht 65.0 in | Wt 184.0 lb

## 2022-05-04 DIAGNOSIS — Z1231 Encounter for screening mammogram for malignant neoplasm of breast: Secondary | ICD-10-CM

## 2022-05-04 DIAGNOSIS — Z0000 Encounter for general adult medical examination without abnormal findings: Secondary | ICD-10-CM | POA: Diagnosis not present

## 2022-05-04 NOTE — Progress Notes (Signed)
    SUBJECTIVE:   Chief compliant/HPI: annual examination  Erica Hurst is a 41 y.o. who presents today for an annual exam.   Requests mamogram. Denie family history of breast cancer   Denies tobacco, alcohol, recreational drugs   Not taking any medications  No concern for STIs   IUD in place LMP last week Pap done in feb 2023   Exercises 10 min a week   OBJECTIVE:   BP 107/75   Pulse 75   Temp 98.5 F (36.9 C)   Wt 184 lb (83.5 kg)   SpO2 100%   BMI 30.62 kg/m    General: alert, pleasant, NAD CV: RRR no murmurs Resp: CTAB normal WOB GI: soft, non distended   ASSESSMENT/PLAN:   No problem-specific Assessment & Plan notes found for this encounter.    Annual Examination  See AVS for age appropriate recommendations.   PHQ score 0, reviewed and discussed.  Blood pressure reviewed and at goal.  Asked about intimate partner violence and resources given as appropriate  The patient currently has IUD for contraception.   Considered the following items based upon USPSTF recommendations: Diabetes screening:  not ordered STI testing not ordered Consider lipid panel at follow up Reviewed risk factors for latent tuberculosis and not indicated Reviewed risk factors for osteoporosis in which she is low risk  Cervical cancer screening: prior Pap reviewed, repeat due in 2028 Breast cancer screening: discussed and ordered per patient's request   Follow up in 1 year or sooner if indicated.    [place]

## 2022-05-04 NOTE — Patient Instructions (Signed)
It was great seeing you today!  I am glad you are doing well!  If your mammogram and you can call the breast center to schedule an appointment to get that done.  I recommend getting in physical activity daily, aiming for 150 minutes a week.  Also try incorporating more fruits and vegetables into your diet  I will see you back next year for your next check up, but if you need to be seen earlier than that for any new issues we're happy to fit you in, just give [place] a call!   Feel free to call with any questions or concerns at any time, at [phone].   Take care,  Dr. Shary Key [facility]

## 2022-06-25 ENCOUNTER — Ambulatory Visit
Admission: RE | Admit: 2022-06-25 | Discharge: 2022-06-25 | Disposition: A | Discharge status: Home / Self Care | Payer: Medicaid Other | Source: Ambulatory Visit | Attending: Family Medicine | Admitting: Family Medicine

## 2022-06-25 DIAGNOSIS — Z1231 Encounter for screening mammogram for malignant neoplasm of breast: Secondary | ICD-10-CM

## 2022-12-10 ENCOUNTER — Telehealth: Payer: Self-pay

## 2022-12-14 NOTE — Telephone Encounter (Signed)
error 

## 2022-12-22 IMAGING — US US ABDOMEN LIMITED W/ ELASTOGRAPHY
2 series · 12 of 25 positions shown · non-contrast
Comparison: None.

CLINICAL DATA: Hepatitis B

EXAM:
US ABDOMEN LIMITED - RIGHT UPPER QUADRANT
ULTRASOUND HEPATIC ELASTOGRAPHY
TECHNIQUE: Sonography of the right upper quadrant was performed. In addition,
ultrasound elastography evaluation of the liver was performed. A
region of interest was placed within the right lobe of the liver.
Following application of a compressive sonographic pulse, tissue
compressibility was assessed. Multiple assessments were performed at
the selected site. Median tissue compressibility was determined.
Previously, hepatic stiffness was assessed by shear wave velocity.
Based on recently published Society of Radiologists in Ultrasound
consensus article, reporting is now recommended to be performed in
the SI units of pressure (kiloPascals) representing hepatic
stiffness/elasticity. The obtained result is compared to the
published reference standards. (cACLD = compensated Advanced Chronic
Liver Disease)

[Series 1: us abdomen ruq w/elastography · 8 of 35 slices shown]
[im 3/35]
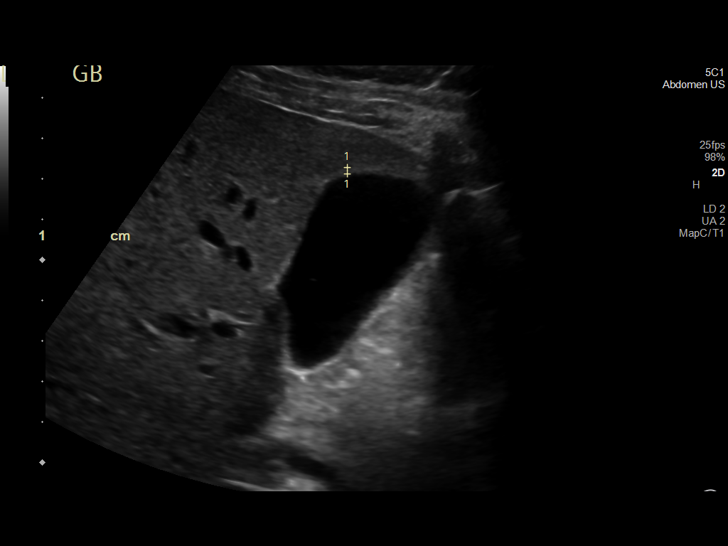
[im 7/35]
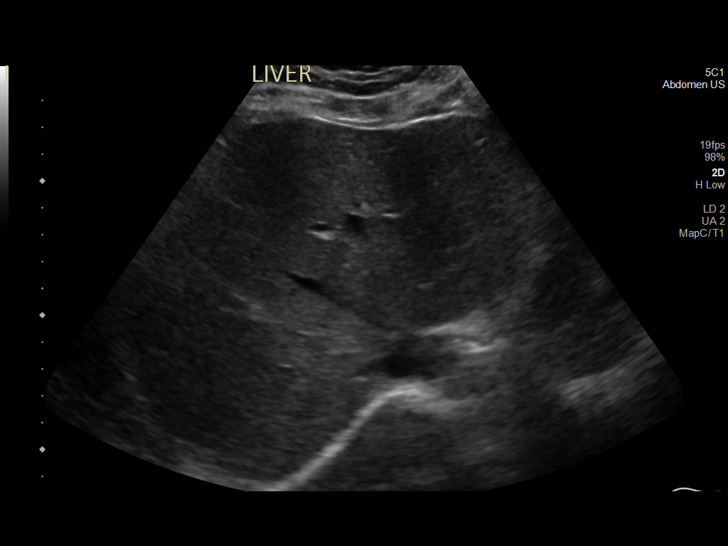
[im 12/35]
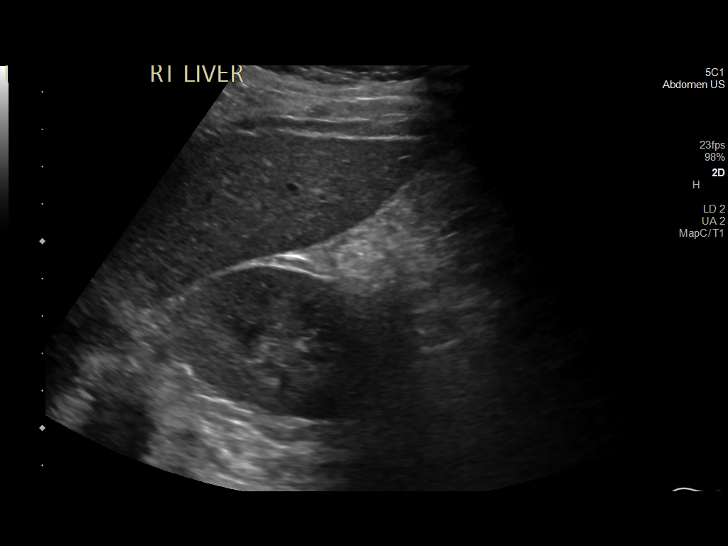
[im 16/35]
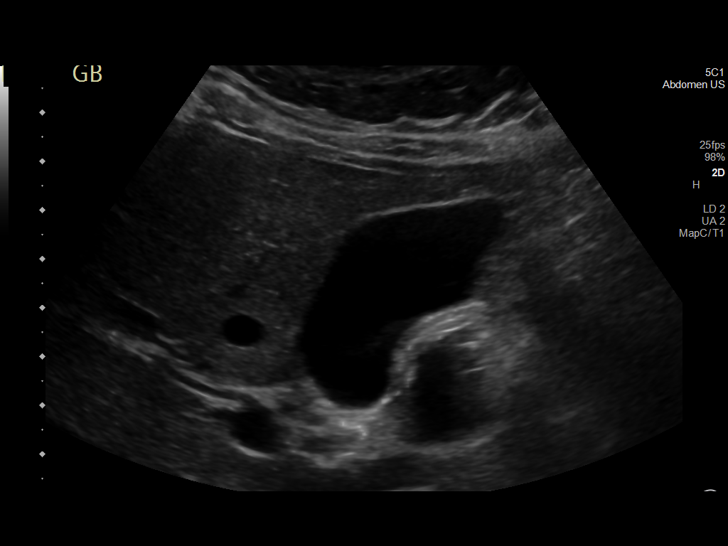
[im 21/35]
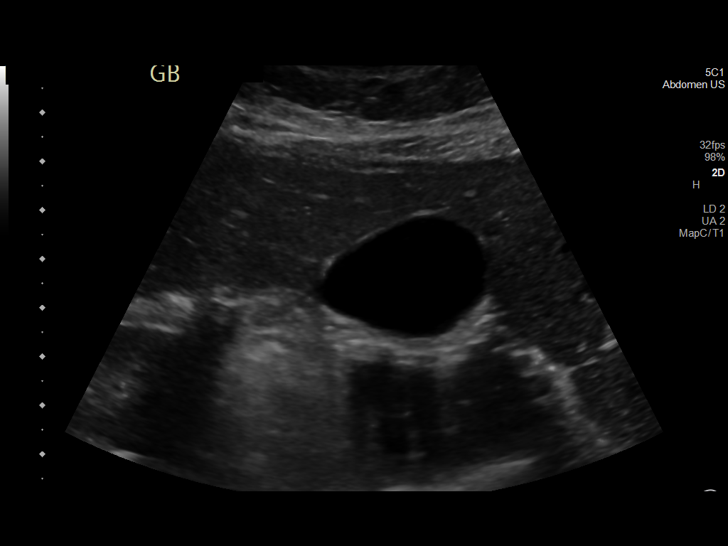
[im 25/35]
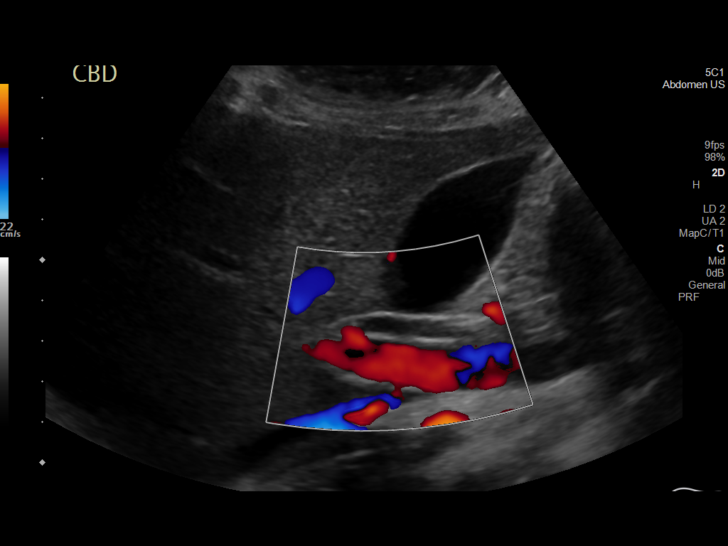
[im 30/35]
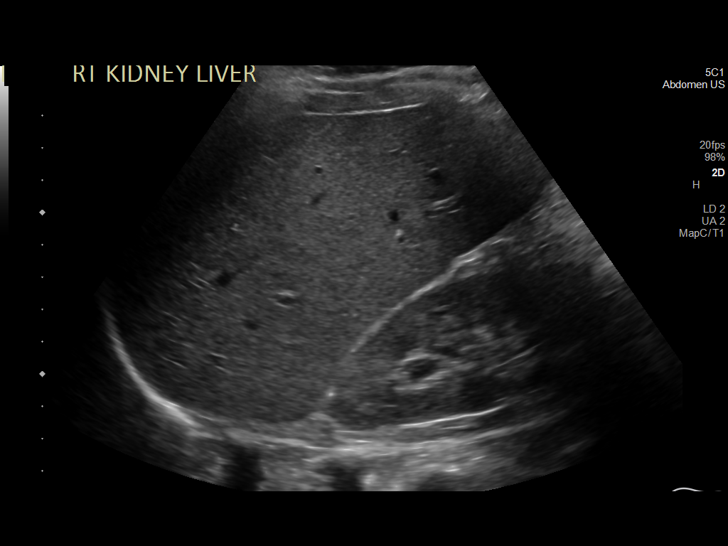
[im 35/35]
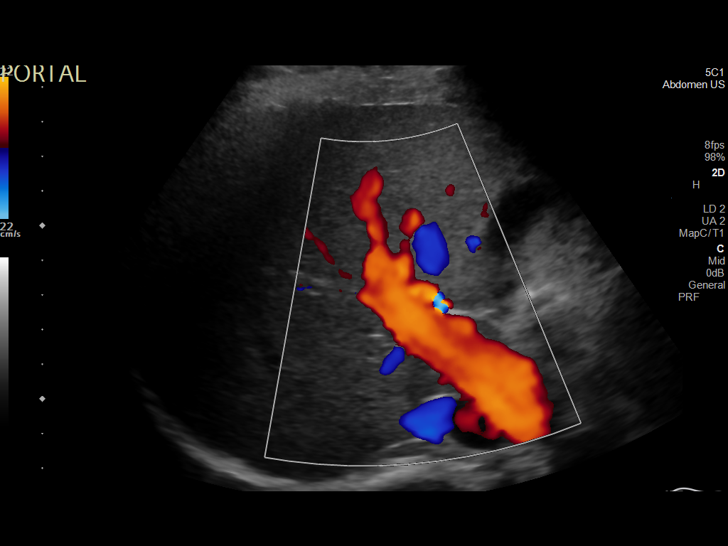

[Series 1001: abdomen us · 4 of 21 slices shown]
[im 3/21]
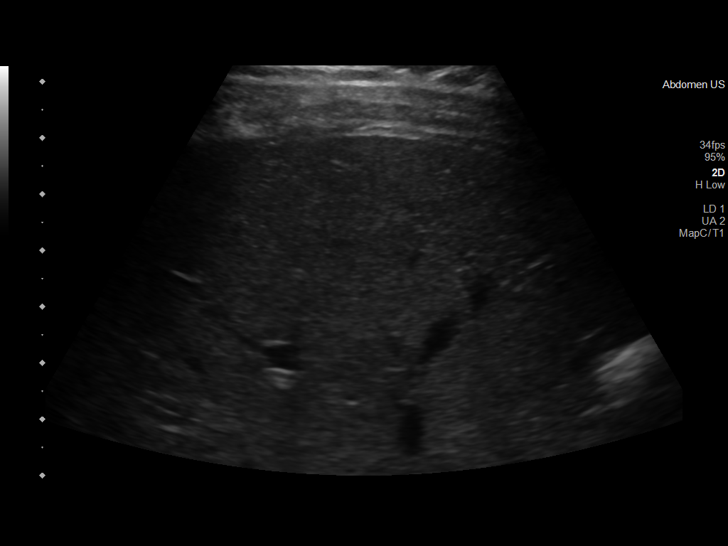
[im 8/21]
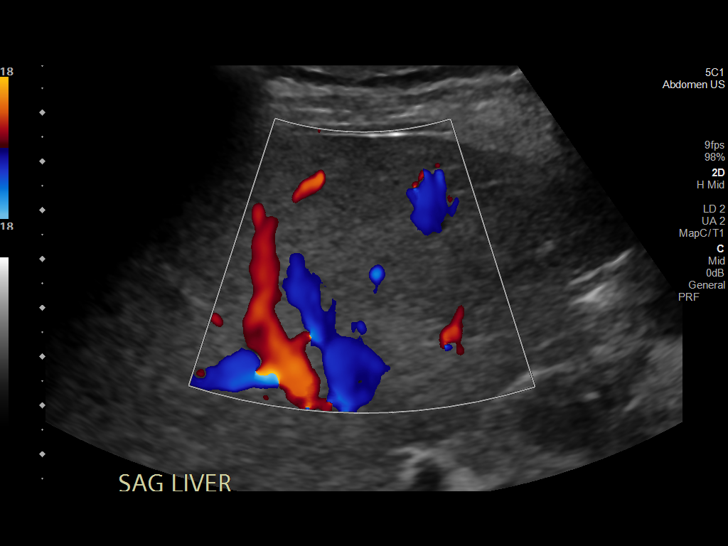
[im 13/21]
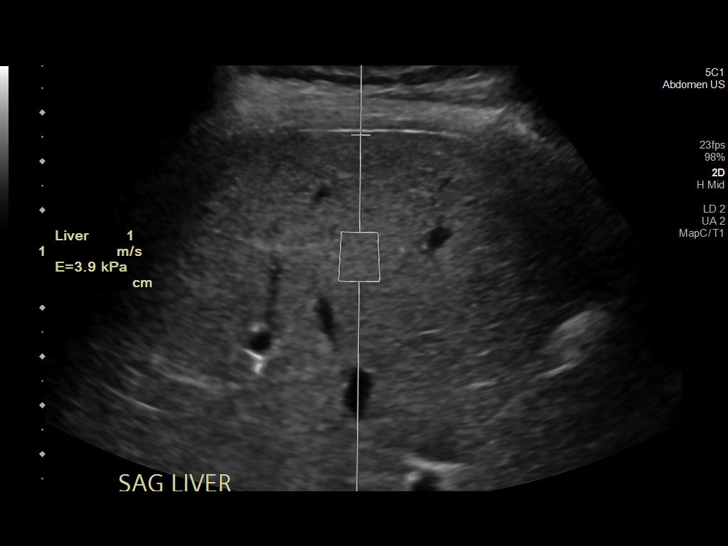
[im 18/21]
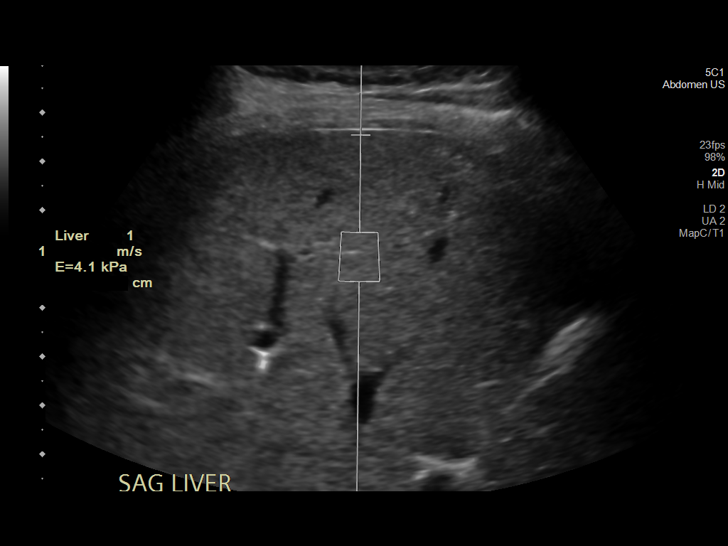

[12 of 25 positions shown; findings below may reference images not displayed]

FINDINGS: ULTRASOUND ABDOMEN LIMITED RIGHT UPPER QUADRANT

Gallbladder:

No gallstones or wall thickening visualized. No sonographic Murphy
sign noted.

Common bile duct:

Diameter: 3.9 mm

Liver:

No focal lesion identified. Within normal limits in parenchymal
echogenicity. Portal vein is patent on color Doppler imaging with
normal direction of blood flow towards the liver.

ULTRASOUND HEPATIC ELASTOGRAPHY

Device: Siemens Helix VTQ

Patient position: Supine

Transducer 5C1

Number of measurements: 10

Hepatic segment:  8

Median kPa:

IQR:

IQR/Median kPa ratio:

Data quality:  Good

Diagnostic category:  < or = 5 kPa: high probability of being normal

The use of hepatic elastography is applicable to patients with viral
hepatitis and non-alcoholic fatty liver disease. At this time, there
is insufficient data for the referenced cut-off values and use in
other causes of liver disease, including alcoholic liver disease.
Patients, however, may be assessed by elastography and serve as
their own reference standard/baseline.

In patients with non-alcoholic liver disease, the values suggesting
compensated advanced chronic liver disease (cACLD) may be lower, and
patients may need additional testing with elasticity results of [DATE]
kPa.

Please note that abnormal hepatic elasticity and shear wave
velocities may also be identified in clinical settings other than
with hepatic fibrosis, such as: acute hepatitis, elevated right
heart and central venous pressures including use of beta blockers,
Nikk disease (Consuelo), infiltrative processes such as
mastocytosis/amyloidosis/infiltrative tumor/lymphoma, extrahepatic
cholestasis, with hyperemia in the post-prandial state, and with
liver transplantation. Correlation with patient history, laboratory
data, and clinical condition recommended.

Diagnostic Categories:

< or =5 kPa: high probability of being normal

< or =9 kPa: in the absence of other known clinical signs, rules [DATE] kPa and ?13 kPa: suggestive of cACLD, but needs further testing

>13 kPa: highly suggestive of cACLD

> or =17 kPa: highly suggestive of cACLD with an increased
probability of clinically significant portal hypertension
IMPRESSION: ULTRASOUND RUQ:

Normal

ULTRASOUND HEPATIC ELASTOGRAPHY:

Median kPa:

Diagnostic category:  < or = 5 kPa: high probability of being normal

## 2023-07-25 IMAGING — US US PELVIS COMPLETE WITH TRANSVAGINAL
1 series · 14 of 25 positions shown · non-contrast
Comparison: None

CLINICAL DATA: Missing IUD strings



[Series 1: us pelvic complete with transvaginal · 47 acquisitions, 14 frames shown]
[im 1/47]
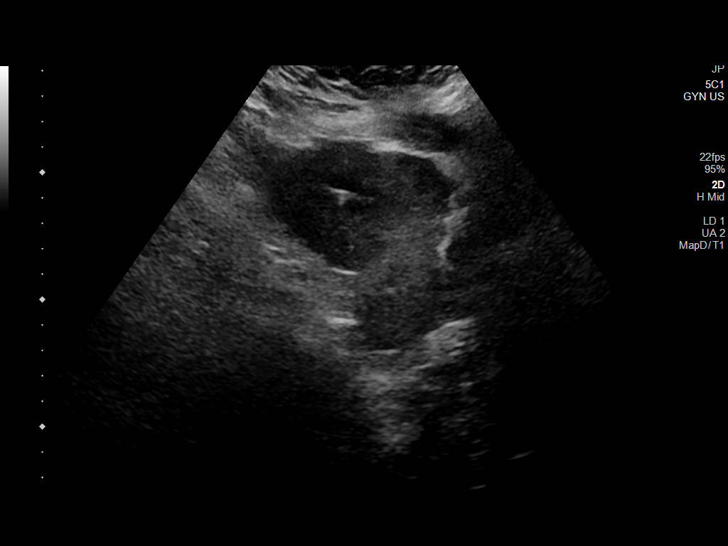
[im 4/47]
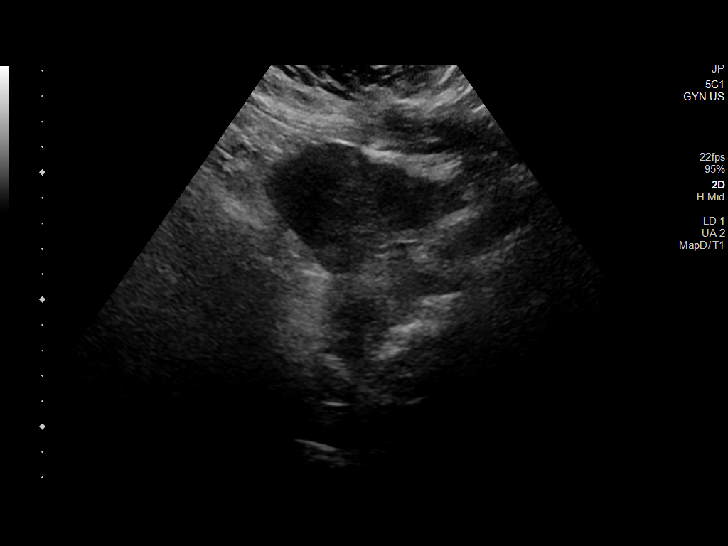
[im 8/47]
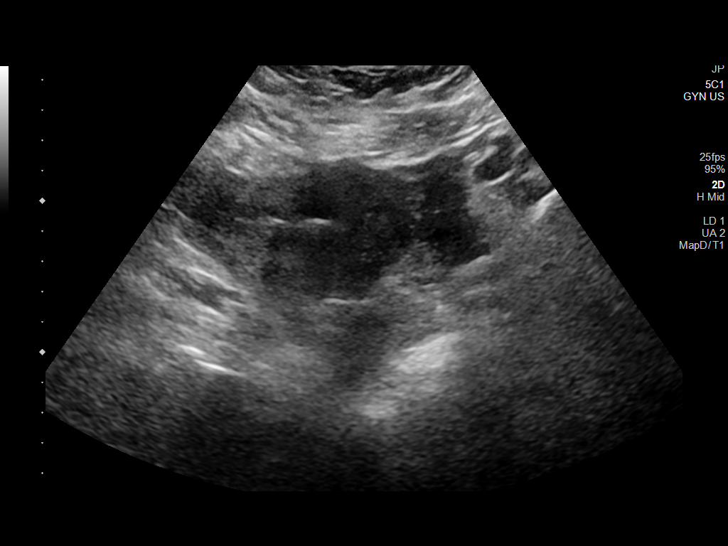
[im 12/47]
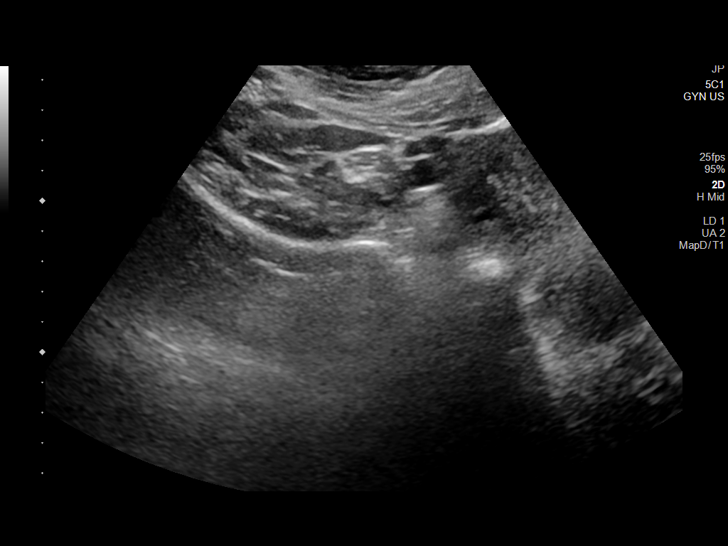
[im 16/47]
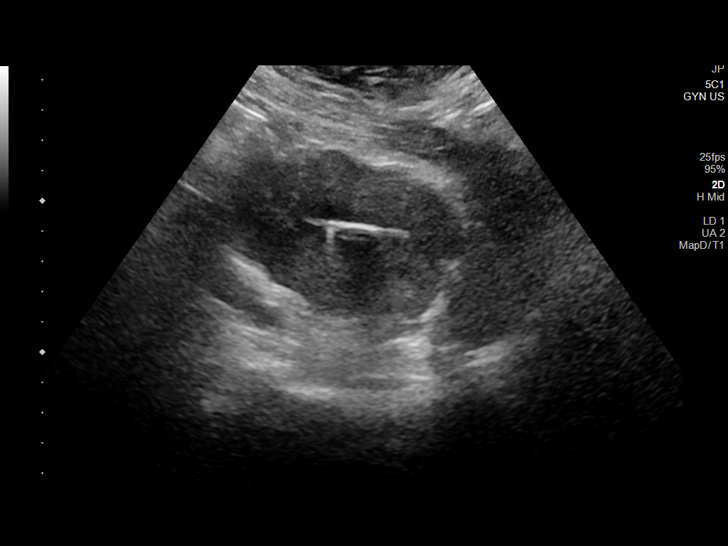
[im 18/47]
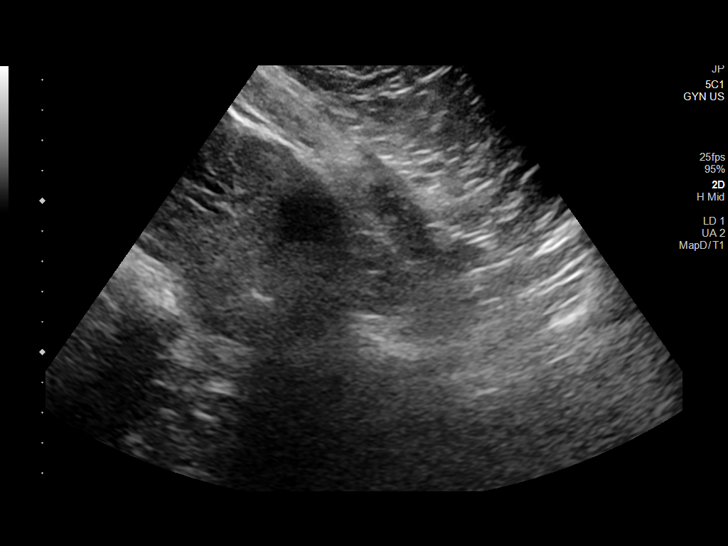
[im 22/47]
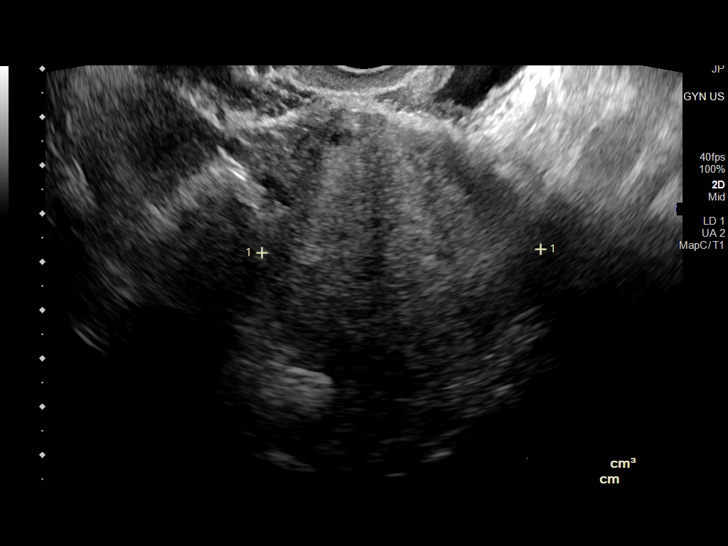
[im 25/47]
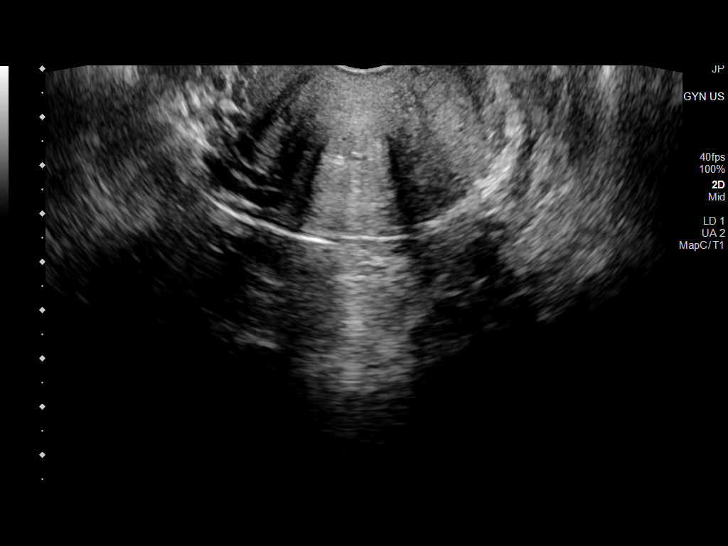
[im 29/47]
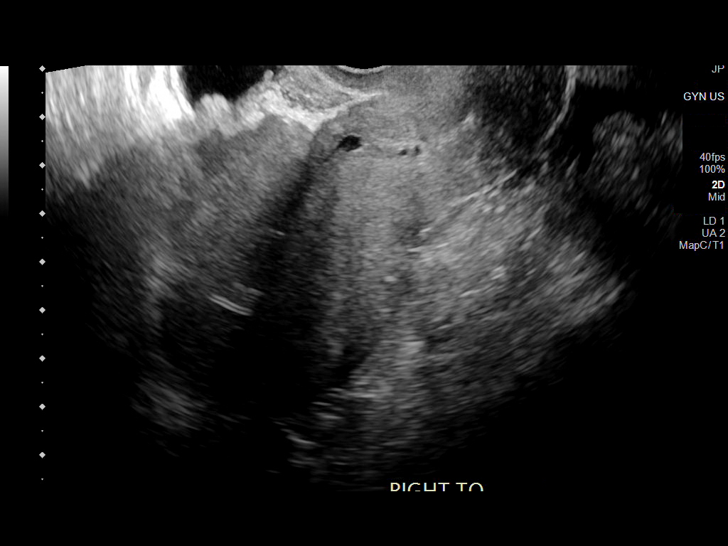
[im 31/47]
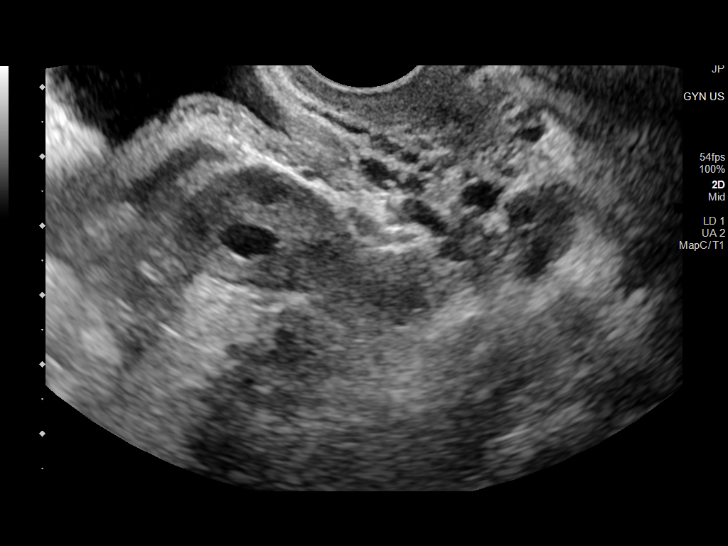
[im 35/47]
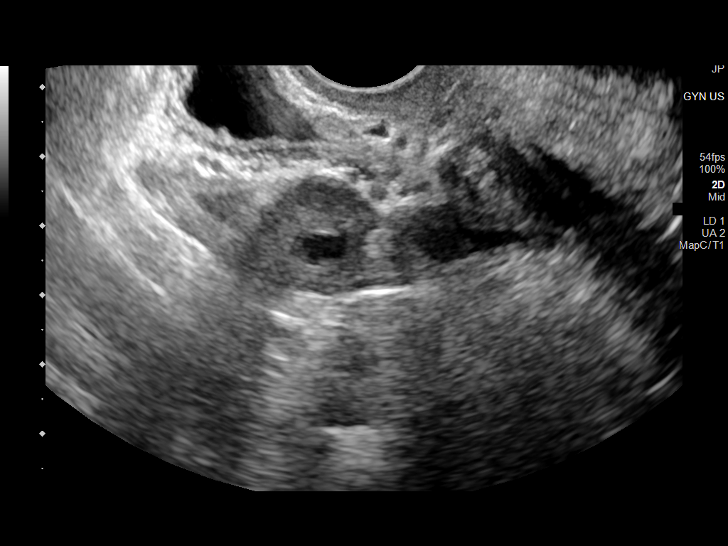
[im 39/47]
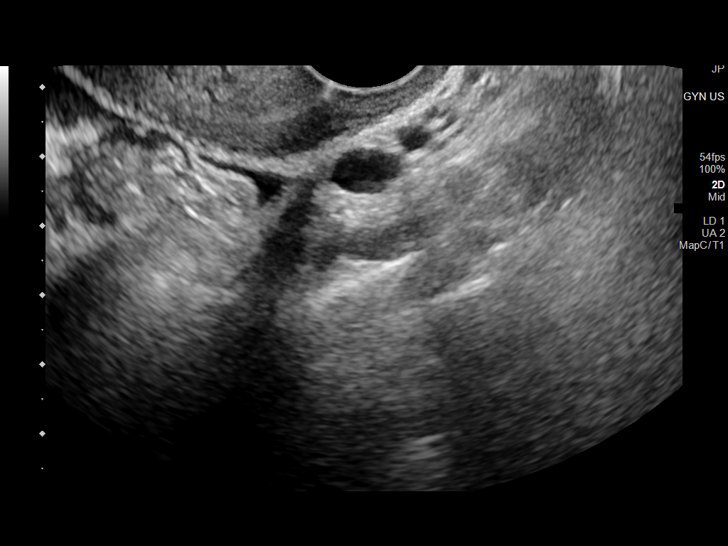
[im 43/47]
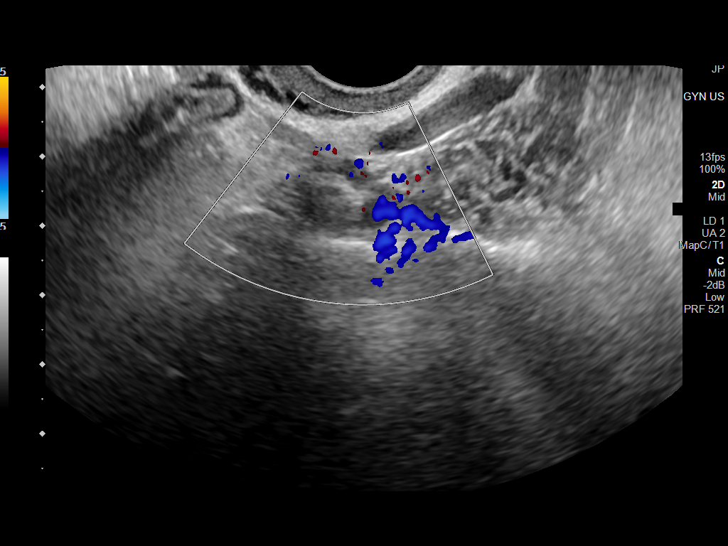
[im 47/47]
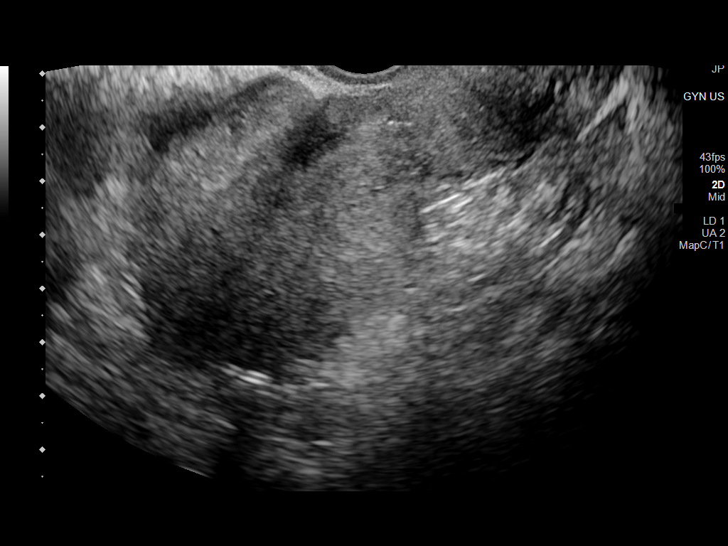

[14 of 25 positions shown; findings below may reference images not displayed]

FINDINGS: Uterus

Measurements: 9.3 x 6.1 x 6.1 cm = volume: 181 mL. No fibroids or
other mass visualized. IUD is appropriately positioned in the fundal
endometrial cavity.

Endometrium

Thickness: 12 mm.  No focal abnormality visualized.

Right ovary

Measurements: 3.9 x 1.6 x 1.9 cm = volume: 7 mL. Normal
appearance/no adnexal mass. Subcentimeter follicles.

Left ovary

Measurements: 2.3 x 1.2 x 1.6 cm = volume: 3 mL. Normal
appearance/no adnexal mass. Subcentimeter follicles.

Other findings

No abnormal free fluid.
IMPRESSION: 1. IUD is appropriately positioned in the fundal endometrial cavity.
2. Otherwise normal pelvic ultrasound.

## 2024-01-09 ENCOUNTER — Emergency Department (HOSPITAL_COMMUNITY)
Admission: EM | Admit: 2024-01-09 | Discharge: 2024-01-10 | Disposition: A | Discharge status: Home / Self Care | Attending: Emergency Medicine | Admitting: Emergency Medicine

## 2024-01-09 DIAGNOSIS — M25511 Pain in right shoulder: Secondary | ICD-10-CM | POA: Diagnosis present

## 2024-01-09 DIAGNOSIS — M65221 Calcific tendinitis, right upper arm: Secondary | ICD-10-CM | POA: Insufficient documentation

## 2024-01-09 DIAGNOSIS — M6520 Calcific tendinitis, unspecified site: Secondary | ICD-10-CM

## 2024-01-10 ENCOUNTER — Other Ambulatory Visit: Payer: Self-pay

## 2024-01-10 ENCOUNTER — Emergency Department (HOSPITAL_COMMUNITY)

## 2024-01-10 ENCOUNTER — Encounter (HOSPITAL_COMMUNITY): Payer: Self-pay

## 2024-01-10 MED ORDER — KETOROLAC TROMETHAMINE 15 MG/ML IJ SOLN
15.0000 mg | Freq: Once | INTRAMUSCULAR | Status: AC
  Administered 2024-01-10: 15 mg via INTRAMUSCULAR
  Filled 2024-01-10: qty 1

## 2024-01-10 NOTE — ED Triage Notes (Signed)
 Pt arrived from home via POV c/o right shoulder pain x 2 days 9/10 on pain scale. Pt denies injury. Noted limited ROM

## 2024-01-10 NOTE — Discharge Instructions (Signed)
 You were evaluated in the emergency room for right shoulder pain.  Your x-ray and exam are consistent with calcific tendinitis.  Please you may alternate Tylenol  1000 mg every 4-6 hours up to 4000 mg a day and/or ibuprofen /Motrin  600 to 800 mg every 4-6 hours up to 3000 mg a day on a full stomach.  Please call the number on the sheet to follow-up with orthopedics.

## 2024-01-10 NOTE — ED Provider Notes (Signed)
[name] Provider Note   CSN: [id number] Arrival date & time: 01/09/24  2331     Patient presents with: Shoulder Pain   Erica Hurst is a 43 y.o. female otherwise healthy presents with complaints of atraumatic right shoulder pain x 2 days.  Has tried Tylenol  without significant improvement.  Denies any chest pain, shortness of breath.  No fevers or chills.  No numbness or tingling.  Pain is worse with right upper extremity range of motion.    Shoulder Pain     Past Medical History:  Diagnosis Date   Fibroid    Hepatitis B affecting pregnancy    Past Surgical History:  Procedure Laterality Date   CESAREAN SECTION N/A 05/15/2014   Procedure: CESAREAN SECTION;  Surgeon: Jon CINDERELLA Rummer, MD;  Location: [facility];  Service: Obstetrics;  Laterality: N/A;   DILITATION & CURRETTAGE/HYSTROSCOPY WITH VERSAPOINT RESECTION N/A 06/30/2012   Procedure: DILATATION & CURETTAGE/HYSTEROSCOPY WITH VERSAPOINT RESECTION;  Surgeon: Percilla Burly, MD;  Location: [facility];  Service: Gynecology;  Laterality: N/A;   HYSTEROSCOPY WITH D & C N/A 10/07/2021   Procedure: DILATATION, HYSTEROSCOPY AND IUD REMOVAL;  Surgeon: Cleotilde Ronal RAMAN, MD;  Location: [facility];  Service: Gynecology;  Laterality: N/A;   INTRAUTERINE DEVICE (IUD) INSERTION N/A 10/07/2021   Procedure: INTRAUTERINE DEVICE (IUD) INSERTION;  Surgeon: Cleotilde Ronal RAMAN, MD;  Location: [facility] [place];  Service: Gynecology;  Laterality: N/A;     Prior to Admission medications   Medication Sig Start Date End Date Taking? Authorizing Provider  ibuprofen  (ADVIL ) 800 MG tablet Take 1 tablet (800 mg total) by mouth every 8 (eight) hours as needed for mild pain, moderate pain or cramping. 10/07/21   Cleotilde Ronal RAMAN, MD  Pseudoeph-Doxylamine-DM-APAP (NYQUIL PO) Take by mouth.    [provider]    Allergies: Patient has no known allergies.    Review of Systems   Musculoskeletal:  Positive for arthralgias.    Updated Vital Signs BP (!) 148/88 (BP Location: Left Arm)   Pulse 64   Temp 98.3 F (36.8 C) (Oral)   Resp 17   Ht 5' 5 (1.651 m)   Wt 77.1 kg   LMP 01/10/2024 (Exact Date)   SpO2 100%   BMI 28.29 kg/m   Physical Exam Vitals and nursing note reviewed.  Constitutional:      General: She is not in acute distress.    Appearance: She is well-developed.  HENT:     Head: Normocephalic and atraumatic.  Eyes:     Conjunctiva/sclera: Conjunctivae normal.  Cardiovascular:     Rate and Rhythm: Normal rate and regular rhythm.     Heart sounds: No murmur heard. Pulmonary:     Effort: Pulmonary effort is normal. No respiratory distress.     Breath sounds: Normal breath sounds.  Musculoskeletal:        General: No swelling.     Cervical back: Neck supple.     Comments: Maintained right upper extremity in guarded position, no overlying erythema warmth, generalized tenderness to the lateral aspect of the shoulder, no gross deformities noted, compartments are soft, no swelling, radial pulses 2+, does not tolerate range of motion due to discomfort.  Skin:    General: Skin is warm and dry.     Capillary Refill: Capillary refill takes less than 2 seconds.  Neurological:     Mental Status: She is alert.  Psychiatric:  Mood and Affect: Mood normal.     (all labs ordered are listed, but only abnormal results are displayed) Labs Reviewed - No data to display  EKG: None  Radiology: DG Shoulder Right Result Date: 01/10/2024 CLINICAL DATA:  Limited range of motion and right shoulder pain x2 days. EXAM: RIGHT SHOULDER - 2+ VIEW COMPARISON:  None Available. FINDINGS: There is no evidence of fracture or dislocation. There is no evidence of arthropathy or other focal bone abnormality. Small calcific opacities are seen within the soft tissues adjacent to the greater tubercle of the right humeral head. IMPRESSION: 1. No acute osseous  abnormality. 2. Findings suggestive of calcific tendinitis. Electronically Signed   By: Suzen Dials M.D.   On: 01/10/2024 00:44     Procedures   Medications Ordered in the ED  ketorolac  (TORADOL ) 15 MG/ML injection 15 mg (has no administration in time range)    Clinical Course as of 01/10/24 0954  Tue Jan 10, 2024  0951 Otherwise healthy patient evaluated for atraumatic right shoulder pain x 2 days.  She is hemodynamically stable.  On exam she maintains her right upper extremity in guarded position and does not tolerate range of motion due to discomfort.  She has no overlying erythema, warmth or swelling.  There are no gross deformities noted on exam.  She is NVI.  Her x-ray is consistent with calcific tendinitis.  Will provide a shot of Toradol  here in the ED and discharged home with Ortho follow-up. [JT]    Clinical Course User Index [JT] Donnajean Lynwood DEL, PA-C                                 Medical Decision Making Amount and/or Complexity of Data Reviewed Radiology: ordered.   This patient presents to the ED with chief complaint(s) of shoulder pain.  The complaint involves an extensive differential diagnosis and also carries with it a high risk of complications and morbidity.   Pertinent past medical history as listed in HPI  The differential diagnosis includes  Patient has no erythema, warmth or swelling to suggest septic joint.  No history of gout.  No fracture or dislocation noted on x-ray.  Has no chest pain or shortness of breath to suggest ACS.  No numbness or tingling to be concern for CVA or TIA.   Additional history obtained: Additional history obtained from spouse Records reviewed Care Everywhere/External Records  Disposition:   Patient will be discharged home. The patient has been appropriately medically screened and/or stabilized in the ED. I have low suspicion for any other emergent medical condition which would require further screening, evaluation or  treatment in the ED or require inpatient management. At time of discharge the patient is hemodynamically stable and in no acute distress. I have discussed work-up results and diagnosis with patient and answered all questions. Patient is agreeable with discharge plan. We discussed strict return precautions for returning to the emergency department and they verbalized understanding.     Social Determinants of Health:   none  This note was dictated with voice recognition software.  Despite best efforts at proofreading, errors may have occurred which can change the documentation meaning.       Final diagnoses:  Calcific tendinitis    ED Discharge Orders     None          Donnajean Lynwood DEL DEVONNA 01/10/24 9045    Yolande Lamar BROCKS, MD 01/13/24  1438  

## 2024-02-03 ENCOUNTER — Ambulatory Visit (INDEPENDENT_AMBULATORY_CARE_PROVIDER_SITE_OTHER)

## 2024-02-03 ENCOUNTER — Other Ambulatory Visit: Payer: Self-pay | Admitting: Family Medicine

## 2024-02-03 DIAGNOSIS — Z1231 Encounter for screening mammogram for malignant neoplasm of breast: Secondary | ICD-10-CM

## 2024-02-03 DIAGNOSIS — Z111 Encounter for screening for respiratory tuberculosis: Secondary | ICD-10-CM

## 2024-02-03 NOTE — Progress Notes (Signed)
 Patient presents to nurse clinic for TB screening.   Per chart review, unable to determine if patient has received BCG vaccine. Received verbal order from Dr. Orie for Earleen Cera testing.   Patient assisted to lab for blood draw.   Chiquita JAYSON English, RN

## 2024-02-07 ENCOUNTER — Ambulatory Visit
Admission: RE | Admit: 2024-02-07 | Discharge: 2024-02-07 | Disposition: A | Discharge status: Home / Self Care | Source: Ambulatory Visit | Attending: Family Medicine | Admitting: Family Medicine

## 2024-02-07 DIAGNOSIS — Z1231 Encounter for screening mammogram for malignant neoplasm of breast: Secondary | ICD-10-CM

## 2024-02-07 LAB — QUANTIFERON-TB GOLD PLUS
QuantiFERON Mitogen Value: >10 [IU]/mL
QuantiFERON Nil Value: 0.08 [IU]/mL
QuantiFERON TB1 Ag Value: 0.08 [IU]/mL
QuantiFERON TB2 Ag Value: 0.09 [IU]/mL

## 2024-02-08 NOTE — Progress Notes (Unsigned)
    SUBJECTIVE:   Chief compliant/HPI: annual examination  Erica Hurst is a 43 y.o. who presents today for an annual exam.   No concerns today. Would like a printout of her TB test result from 9/19.  Walks for exercise. Not on any medications. Negative mammogram a few days ago.  Hx Hep B during pregnancy. Normal RUQ US  in 2022. Hep C previously negative.  Denies CP, SOB, abd pain, N/V. No concerns with sleeping.  OBJECTIVE:   BP 112/75   Pulse 75   Ht 5' 5 (1.651 m)   Wt 189 lb (85.7 kg)   LMP 01/10/2024 (Exact Date)   SpO2 99%   BMI 31.45 kg/m    General: NAD, pleasant, able to participate in exam Cardiac: RRR, no murmurs auscultated Respiratory: CTAB, normal WOB Abdomen: soft, non-tender, non-distended, normoactive bowel sounds Extremities: warm and well perfused, no edema or cyanosis Skin: warm and dry, no rashes noted Neuro: alert, no obvious focal deficits, speech normal Psych: Normal affect and mood  ASSESSMENT/PLAN:   Assessment & Plan Annual physical exam Screening for diabetes mellitus Screening for metabolic disorder As below No acute concerns identified today Chronic hepatitis B (HCC) RUQ US  ordered today CMP and Hep B quant today for monitoring Placed ID referral  Annual Examination  See AVS for age appropriate recommendations.   PHQ score 0, reviewed and discussed.  Blood pressure reviewed and at goal .  Asked about intimate partner violence and resources given as appropriate  The patient currently uses Skyla  IUD for contraception, due for removal 09/2024.   Considered the following items based upon USPSTF recommendations: Diabetes screening: ordered - A1c 5.5 today, normal HIV testing:ordered Hepatitis C: ordered Lipid panel (nonfasting or fasting) discussed based upon AHA recommendations and ordered.  Consider repeat every 4-6 years.  Reviewed risk factors for latent tuberculosis and not indicated negative quant gold  recently    Cervical cancer screening: UTD Breast cancer screening: recently completed and repeat not yet indicated Colorectal cancer screening: not applicable given age.  if age 87 or over.   Follow up in 1 year or sooner if indicated.  MyChart Activation: Already signed up  Payton Coward, MD [facility]

## 2024-02-09 ENCOUNTER — Ambulatory Visit (INDEPENDENT_AMBULATORY_CARE_PROVIDER_SITE_OTHER): Admitting: Family Medicine

## 2024-02-09 ENCOUNTER — Encounter: Payer: Self-pay | Admitting: Family Medicine

## 2024-02-09 VITALS — BP 112/75 | HR 75 | Ht 65.0 in | Wt 189.0 lb

## 2024-02-09 DIAGNOSIS — B181 Chronic viral hepatitis B without delta-agent: Secondary | ICD-10-CM

## 2024-02-09 DIAGNOSIS — Z13228 Encounter for screening for other metabolic disorders: Secondary | ICD-10-CM

## 2024-02-09 DIAGNOSIS — Z1159 Encounter for screening for other viral diseases: Secondary | ICD-10-CM

## 2024-02-09 DIAGNOSIS — Z0000 Encounter for general adult medical examination without abnormal findings: Secondary | ICD-10-CM

## 2024-02-09 DIAGNOSIS — Z23 Encounter for immunization: Secondary | ICD-10-CM

## 2024-02-09 DIAGNOSIS — Z131 Encounter for screening for diabetes mellitus: Secondary | ICD-10-CM | POA: Diagnosis not present

## 2024-02-09 DIAGNOSIS — Z114 Encounter for screening for human immunodeficiency virus [HIV]: Secondary | ICD-10-CM | POA: Diagnosis not present

## 2024-02-09 LAB — POCT GLYCOSYLATED HEMOGLOBIN (HGB A1C): Hemoglobin A1C: 5.5 % (ref 4.0–5.6)

## 2024-02-09 NOTE — Assessment & Plan Note (Addendum)
 RUQ US  ordered today CMP and Hep B quant today for monitoring Placed ID referral

## 2024-02-09 NOTE — Patient Instructions (Addendum)
 If any of your results from today are abnormal and/or require changes to your medical care, I will give you a call. Otherwise, I will send you a letter in the mail or a message on MyChart.   For your chronic hepatitis B I recommend following up with Infectious Disease and also getting an ultrasound of your abdomen. We will need to follow up in 6 months.  You should receive a call soon to schedule your ultrasound.

## 2024-02-10 ENCOUNTER — Ambulatory Visit: Payer: Self-pay | Admitting: Family Medicine

## 2024-02-10 LAB — COMPREHENSIVE METABOLIC PANEL WITH GFR
ALT: 17 IU/L (ref 0–32)
AST: 15 IU/L (ref 0–40)
Albumin: 4.1 g/dL (ref 3.9–4.9)
Alkaline Phosphatase: 46 IU/L (ref 41–116)
BUN/Creatinine Ratio: 13 (ref 9–23)
BUN: 9 mg/dL (ref 6–24)
Bilirubin Total: 0.3 mg/dL (ref 0.0–1.2)
CO2: 21 mmol/L (ref 20–29)
Calcium: 9.3 mg/dL (ref 8.7–10.2)
Chloride: 102 mmol/L (ref 96–106)
Creatinine, Ser: 0.68 mg/dL (ref 0.57–1.00)
Globulin, Total: 3 g/dL (ref 1.5–4.5)
Glucose: 86 mg/dL (ref 70–99)
Potassium: 3.8 mmol/L (ref 3.5–5.2)
Sodium: 139 mmol/L (ref 134–144)
Total Protein: 7.1 g/dL (ref 6.0–8.5)
eGFR: 111 mL/min/1.73 (ref >59)

## 2024-02-10 LAB — LIPID PANEL
Chol/HDL Ratio: 3.7 ratio (ref 0.0–4.4)
Cholesterol, Total: 117 mg/dL (ref 100–199)
HDL: 32 mg/dL — ABNORMAL LOW (ref >39)
LDL Chol Calc (NIH): 67 mg/dL (ref 0–99)
Triglycerides: 95 mg/dL (ref 0–149)
VLDL Cholesterol Cal: 18 mg/dL (ref 5–40)

## 2024-02-10 LAB — HCV AB W REFLEX TO QUANT PCR: HCV Ab: NONREACTIVE

## 2024-02-10 LAB — HEPATITIS B DNA, ULTRAQUANTITATIVE, PCR
HBV DNA SERPL PCR-ACNC: 7700 [IU]/mL
HBV DNA SERPL PCR-LOG IU: 3.886 {Log_IU}/mL

## 2024-02-10 LAB — HCV INTERPRETATION

## 2024-02-10 LAB — HIV ANTIBODY (ROUTINE TESTING W REFLEX): HIV Screen 4th Generation wRfx: NONREACTIVE

## 2024-02-29 ENCOUNTER — Ambulatory Visit (HOSPITAL_COMMUNITY)

## 2024-03-01 ENCOUNTER — Ambulatory Visit: Admitting: Internal Medicine

## 2024-03-08 ENCOUNTER — Ambulatory Visit (HOSPITAL_COMMUNITY)
Admission: RE | Admit: 2024-03-08 | Discharge: 2024-03-08 | Disposition: A | Discharge status: Home / Self Care | Source: Ambulatory Visit | Attending: Family Medicine | Admitting: Family Medicine

## 2024-03-08 DIAGNOSIS — B181 Chronic viral hepatitis B without delta-agent: Secondary | ICD-10-CM | POA: Insufficient documentation

## 2024-03-12 ENCOUNTER — Telehealth: Payer: Self-pay

## 2024-03-12 ENCOUNTER — Ambulatory Visit: Admitting: Internal Medicine

## 2024-03-12 ENCOUNTER — Other Ambulatory Visit (HOSPITAL_COMMUNITY): Payer: Self-pay

## 2024-03-12 NOTE — Telephone Encounter (Signed)
 Pharmacy Patient Advocate Encounter  Insurance verification completed.   The patient is insured through [facility] MEDICAID   Ran test claim for Viread. Currently a quantity of 30 is a 30 day supply and the co-pay is $0.00 . Baraclude $4.00 Vemlidy will need a PA but patient would have tried and failed both medications above 1st.  This test claim was processed through [facility]- copay amounts may vary at other pharmacies due to pharmacy/plan contracts, or as the patient moves through the different stages of their insurance plan.

## 2024-03-15 ENCOUNTER — Ambulatory Visit: Admitting: Internal Medicine

## 2024-03-15 ENCOUNTER — Other Ambulatory Visit: Payer: Self-pay

## 2024-03-15 VITALS — BP 114/77 | HR 72 | Temp 98.6°F | Wt 192.0 lb

## 2024-03-15 DIAGNOSIS — B181 Chronic viral hepatitis B without delta-agent: Secondary | ICD-10-CM | POA: Diagnosis present

## 2024-03-15 DIAGNOSIS — B180 Chronic viral hepatitis B with delta-agent: Secondary | ICD-10-CM

## 2024-03-15 NOTE — Progress Notes (Signed)
 Patient Active Problem List   Diagnosis Date Noted   Encounter for insertion of intrauterine contraceptive device (IUD)    Unsuccessful attempt to remove intrauterine device (IUD)    IUD strings lost 06/26/2021   Healthcare maintenance 06/26/2021   Birth control counseling 06/01/2021   Chronic hepatitis B (HCC) 11/19/2020   S/P cesarean section 05/18/2014    Patient's Medications  New Prescriptions   No medications on file  Previous Medications   IBUPROFEN  (ADVIL ) 800 MG TABLET    Take 1 tablet (800 mg total) by mouth every 8 (eight) hours as needed for mild pain, moderate pain or cramping.   PSEUDOEPH-DOXYLAMINE-DM-APAP (NYQUIL PO)    Take by mouth.  Modified Medications   No medications on file  Discontinued Medications   No medications on file    Subjective: 86 yf with hx of fibroid presents for management of hbv. Previously seen in 2015 while hbv in first trimester pregnancy. Minimal vl at that time. Plan was to f/u few months after delivery. Emigrated from [place] in 2013. She has had 2 additioanl since 2015 pregnancy.     Review of Systems: Review of Systems  All other systems reviewed and are negative.   Past Medical History:  Diagnosis Date   Fibroid    Hepatitis B affecting pregnancy     Social History   Tobacco Use   Smoking status: Never   Smokeless tobacco: Never  Vaping Use   Vaping status: Never Used  Substance Use Topics   Alcohol use: No   Drug use: Never    Family History  Problem Relation Age of Onset   Liver disease Neg Hx     No Known Allergies  Health Maintenance  Topic Date Due   Hepatitis B Vaccines 19-59 Average Risk (1 of 3 - 19+ 3-dose series) 10/12/2000   HPV VACCINES (1 - 3-dose SCDM series) Never done   COVID-19 Vaccine (1 - 2025-26 season) Never done   DTaP/Tdap/Td (2 - Td or Tdap) 02/12/2024   Influenza Vaccine  08/14/2024 (Originally 12/16/2023)   Mammogram  02/06/2026   Cervical Cancer Screening  (HPV/Pap Cotest)  06/26/2026   Pneumococcal Vaccine  Completed   Hepatitis C Screening  Completed   HIV Screening  Completed   Meningococcal B Vaccine  Aged Out    Objective:  Vitals:   03/15/24 1351  BP: 114/77  Pulse: 72  Temp: 98.6 F (37 C)  TempSrc: Oral  SpO2: 98%  Weight: 192 lb (87.1 kg)   Body mass index is 31.95 kg/m.  Physical Exam Constitutional:      Appearance: Normal appearance.  HENT:     Head: Normocephalic and atraumatic.     Right Ear: Tympanic membrane normal.     Left Ear: Tympanic membrane normal.     Nose: Nose normal.     Mouth/Throat:     Mouth: Mucous membranes are moist.  Eyes:     Extraocular Movements: Extraocular movements intact.     Conjunctiva/sclera: Conjunctivae normal.     Pupils: Pupils are equal, round, and reactive to light.  Cardiovascular:     Rate and Rhythm: Normal rate and regular rhythm.     Heart sounds: No murmur heard.    No friction rub. No gallop.  Pulmonary:     Effort: Pulmonary effort is normal.     Breath sounds: Normal breath sounds.  Abdominal:     General: Abdomen is flat.  Palpations: Abdomen is soft.  Musculoskeletal:        General: Normal range of motion.  Skin:    General: Skin is warm and dry.  Neurological:     General: No focal deficit present.     Mental Status: She is alert and oriented to person, place, and time.  Psychiatric:        Mood and Affect: Mood normal.    Physical Exam   Lab Results Lab Results  Component Value Date   WBC 6.7 10/07/2021   HGB 14.3 10/07/2021   HCT 42.0 10/07/2021   MCV 85.2 10/07/2021   PLT 212 10/07/2021    Lab Results  Component Value Date   CREATININE 0.68 02/09/2024   BUN 9 02/09/2024   NA 139 02/09/2024   K 3.8 02/09/2024   CL 102 02/09/2024   CO2 21 02/09/2024    Lab Results  Component Value Date   ALT 17 02/09/2024   AST 15 02/09/2024   ALKPHOS 46 02/09/2024   BILITOT 0.3 02/09/2024    Lab Results  Component Value Date   CHOL  117 02/09/2024   HDL 32 (L) 02/09/2024   LDLCALC 67 02/09/2024   TRIG 95 02/09/2024   CHOLHDL 3.7 02/09/2024   Lab Results  Component Value Date   LABRPR NON REAC 05/14/2014   No results found for: HIV1RNAQUANT, HIV1RNAVL, CD4TABS   Problem List Items Addressed This Visit   None  Results   Assessment/Plan #chronic HBV -7.7k hbv dna on 01/17/27, cmp stable, HIV negative -Reviewed HAV immunue, hcv nr on 02/09/24, HBeAb +, eAg- -RUQ us  no liver lesion on 03/08/24 Plan -f/u 6 months -us  in 6 months  Loney Stank, MD [facility] 03/15/2024, 1:56 PM   I have personally spent 61 minutes involved in face-to-face and non-face-to-face activities for this patient on the day of the visit. Professional time spent includes the following activities: Preparing to see the patient (review of tests), Obtaining and/or reviewing separately obtained history (admission/discharge record), Performing a medically appropriate examination and/or evaluation , Ordering medications/tests/procedures, referring and communicating with other health care professionals, Documenting clinical information in the EMR, Independently interpreting results (not separately reported), Communicating results to the patient/family/caregiver, Counseling and educating the patient/family/caregiver and Care coordination (not separately reported).

## 2024-03-15 NOTE — Patient Instructions (Signed)
 Please obtain lab visit 2 weeks prior to next appt Us  in 6 months

## 2024-08-27 ENCOUNTER — Other Ambulatory Visit (HOSPITAL_COMMUNITY)

## 2024-08-30 ENCOUNTER — Other Ambulatory Visit

## 2024-09-13 ENCOUNTER — Ambulatory Visit: Payer: Self-pay | Admitting: Internal Medicine
